# Patient Record
Sex: Female | Born: 1980 | Race: White | Hispanic: No | Marital: Married | Smoking: Current every day smoker
Health system: Southern US, Community
[De-identification: ages and names within clinical notes are randomized; demographics above are authoritative.]

## PROBLEM LIST (undated history)

## (undated) DIAGNOSIS — F1910 Other psychoactive substance abuse, uncomplicated: Secondary | ICD-10-CM

## (undated) DIAGNOSIS — F329 Major depressive disorder, single episode, unspecified: Secondary | ICD-10-CM

## (undated) DIAGNOSIS — J45909 Unspecified asthma, uncomplicated: Secondary | ICD-10-CM

## (undated) DIAGNOSIS — F1124 Opioid dependence with opioid-induced mood disorder: Secondary | ICD-10-CM

## (undated) DIAGNOSIS — F419 Anxiety disorder, unspecified: Secondary | ICD-10-CM

## (undated) DIAGNOSIS — F32A Depression, unspecified: Secondary | ICD-10-CM

---

## 2004-02-06 ENCOUNTER — Observation Stay: Payer: Self-pay

## 2004-02-22 ENCOUNTER — Observation Stay: Payer: Self-pay | Admitting: Unknown Physician Specialty

## 2004-02-24 ENCOUNTER — Observation Stay: Payer: Self-pay | Admitting: Unknown Physician Specialty

## 2004-02-24 ENCOUNTER — Observation Stay: Payer: Self-pay | Admitting: Obstetrics & Gynecology

## 2004-03-16 ENCOUNTER — Observation Stay: Payer: Self-pay

## 2004-03-17 ENCOUNTER — Inpatient Hospital Stay: Payer: Self-pay | Admitting: Obstetrics & Gynecology

## 2004-07-26 ENCOUNTER — Emergency Department: Payer: Self-pay | Admitting: Emergency Medicine

## 2004-11-11 ENCOUNTER — Emergency Department: Payer: Self-pay | Admitting: Emergency Medicine

## 2004-11-15 ENCOUNTER — Emergency Department: Payer: Self-pay | Admitting: General Practice

## 2005-10-22 ENCOUNTER — Emergency Department: Payer: Self-pay | Admitting: Emergency Medicine

## 2006-01-22 ENCOUNTER — Emergency Department: Payer: Self-pay | Admitting: Unknown Physician Specialty

## 2006-08-20 ENCOUNTER — Emergency Department: Payer: Self-pay | Admitting: Emergency Medicine

## 2006-08-21 ENCOUNTER — Ambulatory Visit: Payer: Self-pay | Admitting: Unknown Physician Specialty

## 2007-04-03 ENCOUNTER — Ambulatory Visit: Payer: Self-pay

## 2007-10-06 ENCOUNTER — Observation Stay: Payer: Self-pay

## 2007-10-09 ENCOUNTER — Emergency Department: Payer: Self-pay | Admitting: Emergency Medicine

## 2007-11-04 ENCOUNTER — Observation Stay: Payer: Self-pay

## 2007-11-27 ENCOUNTER — Ambulatory Visit: Payer: Self-pay | Admitting: Obstetrics and Gynecology

## 2007-11-28 ENCOUNTER — Inpatient Hospital Stay: Payer: Self-pay | Admitting: Obstetrics and Gynecology

## 2010-08-05 ENCOUNTER — Emergency Department: Payer: Self-pay | Admitting: Emergency Medicine

## 2010-10-26 ENCOUNTER — Emergency Department: Payer: Self-pay | Admitting: Emergency Medicine

## 2010-11-17 ENCOUNTER — Emergency Department: Payer: Self-pay | Admitting: Emergency Medicine

## 2011-03-07 ENCOUNTER — Emergency Department: Payer: Self-pay | Admitting: Emergency Medicine

## 2012-01-03 ENCOUNTER — Emergency Department: Payer: Self-pay | Admitting: Emergency Medicine

## 2013-03-24 ENCOUNTER — Emergency Department: Payer: Self-pay | Admitting: Emergency Medicine

## 2013-07-11 ENCOUNTER — Emergency Department: Payer: Self-pay | Admitting: Emergency Medicine

## 2013-07-11 LAB — BASIC METABOLIC PANEL
Anion Gap: 4 — ABNORMAL LOW (ref 7–16)
BUN: 16 mg/dL (ref 7–18)
CHLORIDE: 105 mmol/L (ref 98–107)
Calcium, Total: 9.1 mg/dL (ref 8.5–10.1)
Co2: 26 mmol/L (ref 21–32)
Creatinine: 1.07 mg/dL (ref 0.60–1.30)
EGFR (Non-African Amer.): >60
Glucose: 78 mg/dL (ref 65–99)
Osmolality: 270 (ref 275–301)
Potassium: 3.3 mmol/L — ABNORMAL LOW (ref 3.5–5.1)
Sodium: 135 mmol/L — ABNORMAL LOW (ref 136–145)

## 2013-07-11 LAB — CBC WITH DIFFERENTIAL/PLATELET
BASOS ABS: 0 10*3/uL (ref 0.0–0.1)
Basophil %: 0.2 %
EOS ABS: 0.1 10*3/uL (ref 0.0–0.7)
EOS PCT: 0.7 %
HCT: 39.8 % (ref 35.0–47.0)
HGB: 14.1 g/dL (ref 12.0–16.0)
LYMPHS ABS: 1.2 10*3/uL (ref 1.0–3.6)
LYMPHS PCT: 8.3 %
MCH: 31.8 pg (ref 26.0–34.0)
MCHC: 35.6 g/dL (ref 32.0–36.0)
MCV: 90 fL (ref 80–100)
MONOS PCT: 7.9 %
Monocyte #: 1.1 x10 3/mm — ABNORMAL HIGH (ref 0.2–0.9)
NEUTROS PCT: 82.9 %
Neutrophil #: 11.7 10*3/uL — ABNORMAL HIGH (ref 1.4–6.5)
PLATELETS: 193 10*3/uL (ref 150–440)
RBC: 4.45 10*6/uL (ref 3.80–5.20)
RDW: 12.7 % (ref 11.5–14.5)
WBC: 14.2 10*3/uL — ABNORMAL HIGH (ref 3.6–11.0)

## 2013-11-30 ENCOUNTER — Emergency Department: Payer: Self-pay | Admitting: Emergency Medicine

## 2013-11-30 LAB — URINALYSIS, COMPLETE
Bilirubin,UR: NEGATIVE
GLUCOSE, UR: NEGATIVE mg/dL (ref 0–75)
Ketone: NEGATIVE
Leukocyte Esterase: NEGATIVE
NITRITE: NEGATIVE
PH: 5 (ref 4.5–8.0)
PROTEIN: NEGATIVE
RBC,UR: 7 /HPF (ref 0–5)
SPECIFIC GRAVITY: 1.02 (ref 1.003–1.030)
WBC UR: 7 /HPF (ref 0–5)

## 2013-11-30 LAB — CBC WITH DIFFERENTIAL/PLATELET
Basophil #: 0 10*3/uL (ref 0.0–0.1)
Basophil %: 0.5 %
EOS ABS: 0.2 10*3/uL (ref 0.0–0.7)
Eosinophil %: 2.3 %
HCT: 41.5 % (ref 35.0–47.0)
HGB: 14.1 g/dL (ref 12.0–16.0)
LYMPHS ABS: 2 10*3/uL (ref 1.0–3.6)
LYMPHS PCT: 26.5 %
MCH: 31.3 pg (ref 26.0–34.0)
MCHC: 34 g/dL (ref 32.0–36.0)
MCV: 92 fL (ref 80–100)
MONO ABS: 0.4 x10 3/mm (ref 0.2–0.9)
Monocyte %: 5 %
Neutrophil #: 5 10*3/uL (ref 1.4–6.5)
Neutrophil %: 65.7 %
PLATELETS: 222 10*3/uL (ref 150–440)
RBC: 4.51 10*6/uL (ref 3.80–5.20)
RDW: 12.7 % (ref 11.5–14.5)
WBC: 7.5 10*3/uL (ref 3.6–11.0)

## 2013-11-30 LAB — COMPREHENSIVE METABOLIC PANEL
ALBUMIN: 4.2 g/dL (ref 3.4–5.0)
ALT: 17 U/L
ANION GAP: 10 (ref 7–16)
AST: 13 U/L — AB (ref 15–37)
Alkaline Phosphatase: 82 U/L
BUN: 11 mg/dL (ref 7–18)
Bilirubin,Total: 0.4 mg/dL (ref 0.2–1.0)
CREATININE: 0.78 mg/dL (ref 0.60–1.30)
Calcium, Total: 9 mg/dL (ref 8.5–10.1)
Chloride: 105 mmol/L (ref 98–107)
Co2: 28 mmol/L (ref 21–32)
EGFR (African American): >60
EGFR (Non-African Amer.): >60
Glucose: 94 mg/dL (ref 65–99)
OSMOLALITY: 284 (ref 275–301)
POTASSIUM: 3.6 mmol/L (ref 3.5–5.1)
Sodium: 143 mmol/L (ref 136–145)
Total Protein: 7.7 g/dL (ref 6.4–8.2)

## 2013-11-30 LAB — LIPASE, BLOOD: LIPASE: 282 U/L (ref 73–393)

## 2014-02-17 ENCOUNTER — Emergency Department: Payer: Self-pay | Admitting: Emergency Medicine

## 2014-02-17 LAB — CBC
HCT: 40.4 % (ref 35.0–47.0)
HGB: 13.1 g/dL (ref 12.0–16.0)
MCH: 30.7 pg (ref 26.0–34.0)
MCHC: 32.5 g/dL (ref 32.0–36.0)
MCV: 95 fL (ref 80–100)
PLATELETS: 177 10*3/uL (ref 150–440)
RBC: 4.27 10*6/uL (ref 3.80–5.20)
RDW: 12.4 % (ref 11.5–14.5)
WBC: 7.1 10*3/uL (ref 3.6–11.0)

## 2014-02-17 LAB — URINALYSIS, COMPLETE
Bilirubin,UR: NEGATIVE
GLUCOSE, UR: NEGATIVE mg/dL (ref 0–75)
KETONE: NEGATIVE
Leukocyte Esterase: NEGATIVE
NITRITE: NEGATIVE
Ph: 6 (ref 4.5–8.0)
Protein: NEGATIVE
RBC,UR: NONE SEEN /HPF (ref 0–5)
SPECIFIC GRAVITY: 1.024 (ref 1.003–1.030)
Squamous Epithelial: 5

## 2014-02-17 LAB — COMPREHENSIVE METABOLIC PANEL
ALBUMIN: 3.4 g/dL (ref 3.4–5.0)
ALK PHOS: 76 U/L
ALT: 16 U/L
AST: 18 U/L (ref 15–37)
Anion Gap: 8 (ref 7–16)
BUN: 15 mg/dL (ref 7–18)
Bilirubin,Total: 0.3 mg/dL (ref 0.2–1.0)
CHLORIDE: 111 mmol/L — AB (ref 98–107)
Calcium, Total: 8.3 mg/dL — ABNORMAL LOW (ref 8.5–10.1)
Co2: 24 mmol/L (ref 21–32)
Creatinine: 0.67 mg/dL (ref 0.60–1.30)
EGFR (African American): >60
EGFR (Non-African Amer.): >60
Glucose: 87 mg/dL (ref 65–99)
Osmolality: 285 (ref 275–301)
Potassium: 3.8 mmol/L (ref 3.5–5.1)
Sodium: 143 mmol/L (ref 136–145)
TOTAL PROTEIN: 6.5 g/dL (ref 6.4–8.2)

## 2014-05-26 ENCOUNTER — Emergency Department: Payer: Self-pay | Admitting: Emergency Medicine

## 2014-12-14 ENCOUNTER — Emergency Department
Admission: EM | Admit: 2014-12-14 | Discharge: 2014-12-15 | Disposition: A | Discharge status: Other Acute Inpatient Hospital | Payer: Medicaid Other | Attending: Emergency Medicine | Admitting: Emergency Medicine

## 2014-12-14 ENCOUNTER — Encounter: Payer: Self-pay | Admitting: Emergency Medicine

## 2014-12-14 DIAGNOSIS — F329 Major depressive disorder, single episode, unspecified: Secondary | ICD-10-CM | POA: Diagnosis not present

## 2014-12-14 DIAGNOSIS — F1110 Opioid abuse, uncomplicated: Secondary | ICD-10-CM | POA: Insufficient documentation

## 2014-12-14 DIAGNOSIS — R45850 Homicidal ideations: Secondary | ICD-10-CM | POA: Diagnosis present

## 2014-12-14 DIAGNOSIS — Z3202 Encounter for pregnancy test, result negative: Secondary | ICD-10-CM | POA: Insufficient documentation

## 2014-12-14 DIAGNOSIS — N390 Urinary tract infection, site not specified: Secondary | ICD-10-CM | POA: Diagnosis not present

## 2014-12-14 DIAGNOSIS — Z720 Tobacco use: Secondary | ICD-10-CM | POA: Insufficient documentation

## 2014-12-14 DIAGNOSIS — Z79899 Other long term (current) drug therapy: Secondary | ICD-10-CM | POA: Insufficient documentation

## 2014-12-14 DIAGNOSIS — F1410 Cocaine abuse, uncomplicated: Secondary | ICD-10-CM | POA: Diagnosis not present

## 2014-12-14 DIAGNOSIS — F32A Depression, unspecified: Secondary | ICD-10-CM

## 2014-12-14 DIAGNOSIS — F1910 Other psychoactive substance abuse, uncomplicated: Secondary | ICD-10-CM

## 2014-12-14 DIAGNOSIS — F1510 Other stimulant abuse, uncomplicated: Secondary | ICD-10-CM | POA: Diagnosis not present

## 2014-12-14 HISTORY — DX: Unspecified asthma, uncomplicated: J45.909

## 2014-12-14 LAB — URINALYSIS COMPLETE WITH MICROSCOPIC (ARMC ONLY)
Bilirubin Urine: NEGATIVE
Glucose, UA: NEGATIVE mg/dL
Ketones, ur: NEGATIVE mg/dL
Nitrite: POSITIVE — AB
PH: 5 (ref 5.0–8.0)
PROTEIN: NEGATIVE mg/dL
Specific Gravity, Urine: 1.026 (ref 1.005–1.030)

## 2014-12-14 LAB — URINE DRUG SCREEN, QUALITATIVE (ARMC ONLY)
AMPHETAMINES, UR SCREEN: POSITIVE — AB
BENZODIAZEPINE, UR SCRN: NOT DETECTED
Barbiturates, Ur Screen: NOT DETECTED
CANNABINOID 50 NG, UR ~~LOC~~: NOT DETECTED
Cocaine Metabolite,Ur ~~LOC~~: POSITIVE — AB
MDMA (ECSTASY) UR SCREEN: NOT DETECTED
Methadone Scn, Ur: NOT DETECTED
OPIATE, UR SCREEN: POSITIVE — AB
PHENCYCLIDINE (PCP) UR S: NOT DETECTED
Tricyclic, Ur Screen: NOT DETECTED

## 2014-12-14 LAB — COMPREHENSIVE METABOLIC PANEL
ALBUMIN: 4.3 g/dL (ref 3.5–5.0)
ALK PHOS: 81 U/L (ref 38–126)
ALT: 12 U/L — AB (ref 14–54)
AST: 22 U/L (ref 15–41)
Anion gap: 8 (ref 5–15)
BILIRUBIN TOTAL: 0.4 mg/dL (ref 0.3–1.2)
BUN: 13 mg/dL (ref 6–20)
CO2: 26 mmol/L (ref 22–32)
CREATININE: 0.8 mg/dL (ref 0.44–1.00)
Calcium: 8.9 mg/dL (ref 8.9–10.3)
Chloride: 106 mmol/L (ref 101–111)
GFR calc Af Amer: >60 mL/min (ref >60)
GLUCOSE: 91 mg/dL (ref 65–99)
POTASSIUM: 4 mmol/L (ref 3.5–5.1)
Sodium: 140 mmol/L (ref 135–145)
TOTAL PROTEIN: 7.3 g/dL (ref 6.5–8.1)

## 2014-12-14 LAB — ACETAMINOPHEN LEVEL: Acetaminophen (Tylenol), Serum: <10 ug/mL — ABNORMAL LOW (ref 10–30)

## 2014-12-14 LAB — CBC
HEMATOCRIT: 40.2 % (ref 35.0–47.0)
Hemoglobin: 13.3 g/dL (ref 12.0–16.0)
MCH: 30.5 pg (ref 26.0–34.0)
MCHC: 33.1 g/dL (ref 32.0–36.0)
MCV: 92.2 fL (ref 80.0–100.0)
Platelets: 234 10*3/uL (ref 150–440)
RBC: 4.36 MIL/uL (ref 3.80–5.20)
RDW: 12.8 % (ref 11.5–14.5)
WBC: 7.9 10*3/uL (ref 3.6–11.0)

## 2014-12-14 LAB — ETHANOL: Alcohol, Ethyl (B): 7 mg/dL — ABNORMAL HIGH (ref <5)

## 2014-12-14 LAB — SALICYLATE LEVEL: Salicylate Lvl: <4 mg/dL (ref 2.8–30.0)
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
## 2014-12-14 NOTE — ED Provider Notes (Signed)
[facility] Emergency Department Provider Note  ____________________________________________  Time seen: Approximately 11:35 PM  I have reviewed the triage vital signs and the nursing notes.   HISTORY  Chief Complaint Mental Health Problem    HPI Victoria Dorsey is a 34 y.o. female who presents to the ED from home with the chief complaint of depression and homicidal ideation. Patient is depressed over losing custody of her kids. Patient admits to abusing opiates (Percocet, Vicodin); last use yesterday. "I think about hurting people, but not myself". Patient complains of nausea, irritability, restlessness. Denies fever, chills, chest pain, shortness of breath, abdominal pain, diarrhea, weakness, numbness, tingling.   Past Medical History  Diagnosis Date  . Asthma     There are no active problems to display for this patient.   Past Surgical History  Procedure Laterality Date  . Cesarean section      Current Outpatient Rx  Name  Route  Sig  Dispense  Refill  . Buprenorphine HCl-Naloxone HCl (SUBOXONE) 8-2 MG FILM   Sublingual   Place 1 Film under the tongue 2 (two) times daily.           Allergies Review of patient's allergies indicates no known allergies.  No family history on file.  Social History Social History  Substance Use Topics  . Smoking status: Current Every Day Smoker -- 0.50 packs/day    Types: Cigarettes  . Smokeless tobacco: None  . Alcohol Use: Yes     Comment: occasional    Review of Systems Constitutional: No fever/chills Eyes: No visual changes. ENT: No sore throat. Cardiovascular: Denies chest pain. Respiratory: Denies shortness of breath. Gastrointestinal: No abdominal pain.  No nausea, no vomiting.  No diarrhea.  No constipation. Genitourinary: Negative for dysuria. Musculoskeletal: Negative for back pain. Skin: Negative for rash. Neurological: Negative for headaches, focal weakness or  numbness. Psychiatric:Positive for depression and homicidal ideation.  10-point ROS otherwise negative.  ____________________________________________   PHYSICAL EXAM:  VITAL SIGNS: ED Triage Vitals  Enc Vitals Group     BP 12/14/14 2137 116/86 mmHg     Pulse Rate 12/14/14 2137 69     Resp --      Temp 12/14/14 2137 97.9 F (36.6 C)     Temp Source 12/14/14 2137 Oral     SpO2 12/14/14 2137 97 %     Weight 12/14/14 2137 127 lb (57.607 kg)     Height 12/14/14 2137 5\' 1"  (1.549 m)     Head Cir --      Peak Flow --      Pain Score --      Pain Loc --      Pain Edu? --      Excl. in GC? --     Constitutional: Alert and oriented. Disheveled, appears under the influence of substances. Eyes: Conjunctivae are normal. PERRL. EOMI. Head: Atraumatic. Nose: No congestion/rhinnorhea. Mouth/Throat: Mucous membranes are moist.  Oropharynx non-erythematous. Neck: No stridor.   Cardiovascular: Normal rate, regular rhythm. Grossly normal heart sounds.  Good peripheral circulation. Respiratory: Normal respiratory effort.  No retractions. Lungs CTAB. Gastrointestinal: Soft and nontender. No distention. No abdominal bruits. No CVA tenderness. Musculoskeletal: No lower extremity tenderness nor edema.  No joint effusions. Neurologic:  Normal speech and language. No gross focal neurologic deficits are appreciated. No gait instability. Skin:  Skin is warm, dry and intact. No rash noted. Psychiatric: Mood and affect are flat. Speech and behavior are flat.  ____________________________________________   LABS (all  labs ordered are listed, but only abnormal results are displayed)  Labs Reviewed  COMPREHENSIVE METABOLIC PANEL - Abnormal; Notable for the following:    ALT 12 (*)    All other components within normal limits  ETHANOL - Abnormal; Notable for the following:    Alcohol, Ethyl (B) 7 (*)    All other components within normal limits  ACETAMINOPHEN LEVEL - Abnormal; Notable for the  following:    Acetaminophen (Tylenol), Serum <10 (*)    All other components within normal limits  URINE DRUG SCREEN, QUALITATIVE ([facility] ONLY) - Abnormal; Notable for the following:    Amphetamines, Ur Screen POSITIVE (*)    Cocaine Metabolite,Ur [place] POSITIVE (*)    Opiate, Ur Screen POSITIVE (*)    All other components within normal limits  URINALYSIS COMPLETEWITH MICROSCOPIC ([facility] ONLY) - Abnormal; Notable for the following:    Color, Urine YELLOW (*)    APPearance HAZY (*)    Hgb urine dipstick 2+ (*)    Nitrite POSITIVE (*)    Leukocytes, UA TRACE (*)    Bacteria, UA MANY (*)    Squamous Epithelial / LPF 6-30 (*)    All other components within normal limits  SALICYLATE LEVEL  CBC  POC URINE PREG, ED   ____________________________________________  EKG  None ____________________________________________  RADIOLOGY  None ____________________________________________   PROCEDURES  Procedure(s) performed: None  Critical Care performed: No  ____________________________________________   INITIAL IMPRESSION / ASSESSMENT AND PLAN / ED COURSE  Pertinent labs & imaging results that were available during my care of the patient were reviewed by me and considered in my medical decision making (see chart for details).  34 year old female with history of substance abuse who presents with depression and homicidal ideation. Will place patient under IVC. Antibiotic for UTI. Patient is medically cleared for transfer to [facility] awaiting psychiatry consult.  ----------------------------------------- 5:50 AM on 12/15/2014 -----------------------------------------  Events overnight. Patient resting in no acute distress. Psychiatry consult pending this a.m. ____________________________________________   FINAL CLINICAL IMPRESSION(S) / ED DIAGNOSES  Final diagnoses:  Depression  Homicidal ideation  Substance abuse  UTI (lower urinary tract infection)  Cocaine abuse      Irean Hong, MD 12/15/14 [phone]

## 2014-12-15 ENCOUNTER — Inpatient Hospital Stay (HOSPITAL_COMMUNITY)
Admission: AD | Admit: 2014-12-15 | Discharge: 2014-12-18 | DRG: 897 | Disposition: A | Discharge status: Home / Self Care | Payer: Medicaid Other | Source: Intra-hospital | Attending: Psychiatry | Admitting: Psychiatry

## 2014-12-15 ENCOUNTER — Encounter (HOSPITAL_COMMUNITY): Payer: Self-pay | Admitting: *Deleted

## 2014-12-15 DIAGNOSIS — F329 Major depressive disorder, single episode, unspecified: Secondary | ICD-10-CM | POA: Diagnosis present

## 2014-12-15 DIAGNOSIS — F419 Anxiety disorder, unspecified: Secondary | ICD-10-CM | POA: Diagnosis present

## 2014-12-15 DIAGNOSIS — F17210 Nicotine dependence, cigarettes, uncomplicated: Secondary | ICD-10-CM | POA: Diagnosis present

## 2014-12-15 DIAGNOSIS — F1124 Opioid dependence with opioid-induced mood disorder: Secondary | ICD-10-CM | POA: Diagnosis not present

## 2014-12-15 DIAGNOSIS — J45909 Unspecified asthma, uncomplicated: Secondary | ICD-10-CM | POA: Diagnosis present

## 2014-12-15 DIAGNOSIS — F1910 Other psychoactive substance abuse, uncomplicated: Secondary | ICD-10-CM | POA: Diagnosis present

## 2014-12-15 DIAGNOSIS — F1410 Cocaine abuse, uncomplicated: Secondary | ICD-10-CM | POA: Diagnosis not present

## 2014-12-15 HISTORY — DX: Anxiety disorder, unspecified: F41.9

## 2014-12-15 HISTORY — DX: Major depressive disorder, single episode, unspecified: F32.9

## 2014-12-15 HISTORY — DX: Depression, unspecified: F32.A

## 2014-12-15 LAB — PREGNANCY, URINE: PREG TEST UR: NEGATIVE

## 2014-12-15 MED ORDER — ONDANSETRON 4 MG PO TBDP: 4.0000 mg | ORAL_TABLET | Freq: Four times a day (QID) | ORAL | Status: DC | PRN

## 2014-12-15 MED ORDER — SULFAMETHOXAZOLE-TRIMETHOPRIM 800-160 MG PO TABS
1.0000 | ORAL_TABLET | Freq: Two times a day (BID) | ORAL | Status: DC
  Administered 2014-12-15: 1 via ORAL
  Filled 2014-12-15 (×3): qty 1

## 2014-12-15 MED ORDER — LOPERAMIDE HCL 2 MG PO CAPS: 2.0000 mg | ORAL_CAPSULE | ORAL | Status: DC | PRN

## 2014-12-15 MED ORDER — MAGNESIUM HYDROXIDE 400 MG/5ML PO SUSP: 30.0000 mL | Freq: Every day | ORAL | Status: DC | PRN

## 2014-12-15 MED ORDER — ALUM & MAG HYDROXIDE-SIMETH 200-200-20 MG/5ML PO SUSP: 30.0000 mL | ORAL | Status: DC | PRN

## 2014-12-15 MED ORDER — NAPROXEN 500 MG PO TABS
500.0000 mg | ORAL_TABLET | Freq: Two times a day (BID) | ORAL | Status: DC | PRN
  Administered 2014-12-15 – 2014-12-16 (×3): 500 mg via ORAL
  Filled 2014-12-15 (×3): qty 1

## 2014-12-15 MED ORDER — NICOTINE 10 MG IN INHA: 1.0000 | RESPIRATORY_TRACT | Status: DC | PRN

## 2014-12-15 MED ORDER — METHOCARBAMOL 500 MG PO TABS
500.0000 mg | ORAL_TABLET | Freq: Three times a day (TID) | ORAL | Status: DC | PRN
  Administered 2014-12-15 – 2014-12-16 (×3): 500 mg via ORAL
  Filled 2014-12-15 (×3): qty 1

## 2014-12-15 MED ORDER — NICOTINE POLACRILEX 2 MG MT GUM
2.0000 mg | CHEWING_GUM | OROMUCOSAL | Status: DC | PRN
  Administered 2014-12-15: 2 mg via ORAL

## 2014-12-15 MED ORDER — SULFAMETHOXAZOLE-TRIMETHOPRIM 800-160 MG PO TABS
ORAL_TABLET | ORAL | Status: AC
  Filled 2014-12-15: qty 1

## 2014-12-15 MED ORDER — CLONIDINE HCL 0.1 MG PO TABS
0.1000 mg | ORAL_TABLET | ORAL | Status: DC
  Filled 2014-12-15 (×3): qty 1

## 2014-12-15 MED ORDER — HYDROXYZINE HCL 25 MG PO TABS
25.0000 mg | ORAL_TABLET | Freq: Four times a day (QID) | ORAL | Status: DC | PRN
  Administered 2014-12-15 – 2014-12-16 (×2): 25 mg via ORAL
  Filled 2014-12-15 (×2): qty 1

## 2014-12-15 MED ORDER — DICYCLOMINE HCL 20 MG PO TABS: 20.0000 mg | ORAL_TABLET | Freq: Four times a day (QID) | ORAL | Status: DC | PRN

## 2014-12-15 MED ORDER — ACETAMINOPHEN 325 MG PO TABS: 650.0000 mg | ORAL_TABLET | Freq: Four times a day (QID) | ORAL | Status: DC | PRN

## 2014-12-15 MED ORDER — CLONIDINE HCL 0.1 MG PO TABS: 0.1000 mg | ORAL_TABLET | Freq: Every day | ORAL | Status: DC

## 2014-12-15 MED ORDER — CLONIDINE HCL 0.1 MG PO TABS
0.1000 mg | ORAL_TABLET | Freq: Four times a day (QID) | ORAL | Status: AC
  Administered 2014-12-15: 0.1 mg via ORAL
  Filled 2014-12-15 (×12): qty 1

## 2014-12-15 MED ORDER — TRAZODONE HCL 50 MG PO TABS
50.0000 mg | ORAL_TABLET | Freq: Every evening | ORAL | Status: DC | PRN
  Administered 2014-12-15 – 2014-12-16 (×2): 50 mg via ORAL
  Filled 2014-12-15 (×2): qty 1

## 2014-12-15 NOTE — ED Notes (Signed)
Hand-off report given to Gary, RN.  

## 2014-12-15 NOTE — ED Notes (Signed)
Pt. C/o nausea, given ginger ale to settle stomach.

## 2014-12-15 NOTE — Progress Notes (Signed)
LCSW met with patient, she reported she feels very misunderstood and feels badly after she lost her children. She still feels depressed.

## 2014-12-15 NOTE — BHH Counselor (Signed)
Per Dr. Lucianne Muss in the [facility], look for inpatient. Printed [facility] Assessment and Facesheet.

## 2014-12-15 NOTE — ED Notes (Signed)
Pt. Noted sleeping in room. No complaints or concerns voiced. No distress or abnormal behavior noted. Will continue to monitor with security cameras. Q 15 minute rounds continue. 

## 2014-12-15 NOTE — ED Notes (Signed)
Pt Sleeping

## 2014-12-15 NOTE — ED Notes (Signed)
BEHAVIORAL HEALTH ROUNDING Patient sleeping: Yes.   Patient alert and oriented: not applicable Behavior appropriate: Yes.  ; If no, describe:  Nutrition and fluids offered: No Toileting and hygiene offered: Yes  Sitter present: no Law enforcement present: Yes  

## 2014-12-15 NOTE — ED Provider Notes (Signed)
Patient is accepted in transfer to [facility] by Dr. Jama Flavors. I discussed this with Dr. Lucianne Muss of our psychiatric service, and they have arranged transfer. I have reexamined the patient and discussed with the patient risk/benefit who is agreeable. Awake alert. No acute distress vital signs stable at this time.  Sharyn Creamer, MD 12/16/14 [phone]

## 2014-12-15 NOTE — BHH Counselor (Signed)
Pt. is to be admitted to [facility] (Per Intake Minerva Areola) by  Dr. Lucianne Muss. Attending Physician will be Dr. Jama Flavors.  Pt. has been assigned to room 401 Bed 2.   Misty Stanley, ER Sect.; Dr. Fanny Bien, ER MD; Annette Stable, Patient's Nurse were notified.

## 2014-12-15 NOTE — ED Notes (Signed)

## 2014-12-15 NOTE — ED Notes (Signed)
BEHAVIORAL HEALTH ROUNDING Patient sleeping: Yes.   Patient alert and oriented: yes Behavior appropriate: Yes.  ; If no, describe:  Nutrition and fluids offered: Yes  Toileting and hygiene offered: Yes  Sitter present: No Law enforcement present: Yes  

## 2014-12-15 NOTE — ED Notes (Signed)
Pt. To [facility] from ED ambulatory without difficulty, to room 4  . Report from [facility]. Pt. Is alert and oriented, warm and dry in no distress. Pt. Denies SI, HI, and AVH. Pt. Calm and cooperative. Pt. Made aware of security cameras and Q15 minute rounds. Pt. Encouraged to let Nursing staff know of any concerns or needs.

## 2014-12-15 NOTE — Tx Team (Signed)
Initial Interdisciplinary Treatment Plan   PATIENT STRESSORS: Financial difficulties Legal issue Marital or family conflict Substance abuse Traumatic event   PATIENT STRENGTHS: Average or above average intelligence Capable of independent living Communication skills General fund of knowledge Motivation for treatment/growth Physical Health   PROBLEM LIST: Problem List/Patient Goals Date to be addressed Date deferred Reason deferred Estimated date of resolution  "substance abuse" 12/15/2014   D/c  "depression" 12/15/2014   D/c  "anxiety" 12/15/2014   D/c  "suicidal thoughts" 12/15/2014   D/c                                 DISCHARGE CRITERIA:  Ability to meet basic life and health needs Adequate post-discharge living arrangements Improved stabilization in mood, thinking, and/or behavior Medical problems require only outpatient monitoring Motivation to continue treatment in a less acute level of care Need for constant or close observation no longer present Reduction of life-threatening or endangering symptoms to within safe limits Safe-care adequate arrangements made Verbal commitment to aftercare and medication compliance Withdrawal symptoms are absent or subacute and managed without 24-hour nursing intervention  PRELIMINARY DISCHARGE PLAN: Attend aftercare/continuing care group Attend PHP/IOP Attend 12-step recovery group Outpatient therapy Participate in family therapy Return to previous living arrangement  PATIENT/FAMIILY INVOLVEMENT: This treatment plan has been presented to and reviewed with the patient, [place].  The patient and family have been given the opportunity to ask questions and make suggestions.  Earline Mayotte 12/15/2014, 6:30 PM
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
## 2014-12-15 NOTE — ED Notes (Signed)
BEHAVIORAL HEALTH ROUNDING Patient sleeping: No. Patient alert and oriented: yes Behavior appropriate: Yes.  ; If no, describe:  Nutrition and fluids offered: Yes  Toileting and hygiene offered: Yes  Sitter present: no Law enforcement present: Yes  

## 2014-12-15 NOTE — ED Notes (Signed)
BEHAVIORAL HEALTH ROUNDING Patient sleeping: Yes.   Patient alert and oriented: not applicable Behavior appropriate: Yes.  ; If no, describe:  Nutrition and fluids offered: Yes  Toileting and hygiene offered: Yes  Sitter present: no Law enforcement present: Yes  

## 2014-12-15 NOTE — BH Assessment (Signed)
Assessment Note  Victoria Dorsey is an 34 y.o. female. Victoria Dorsey arrived to the ED under the influence of pills.  She reports that "I was depressed. I use pills to take away my depression and it doesn't work".  She reports to the TTS that she takes between 8 to 10 Vicodin and Percocet's a day when she can get them.  She reports that "I'm depressed and I cry all the time".  She further states that she has a lot of anger inside and wants to sleep all the time. "I take my anger out on other people when I am mad". In addition, she states that she does not want to be around other people.  She denied anxiety symptoms.  She denied having auditory or visual hallucinations.  She denied suicidal or homicidal ideation or intent.    Patient reports that she has 4 children, 2 have gone to live with their father. The other 2 children have been placed in foster care.  Victoria Dorsey is scheduled for a court date on 01/15/2015 for school attendance law violation. UDS returned positive results for amphetamines, cocaine, and opiates. Victoria Dorsey states this was her first time using cocaine. ETOH = 7.  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 Asthma    Axis IV: other psychosocial or environmental problems and problems related to legal system/crime Axis V: 41-50 serious symptoms  Past Medical History:  Past Medical History  Diagnosis Date  . Asthma     Past Surgical History  Procedure Laterality Date  . Cesarean section      Family History: No family history on file.  Social History:  reports that she has been smoking Cigarettes.  She has been smoking about 0.50 packs per day. She does not have any smokeless tobacco history on file. She reports that she drinks alcohol. Her drug history is not on file.  Additional Social History:  Alcohol / Drug Use History of alcohol / drug use?: Yes Negative Consequences of Use:  Financial Substance #1 Name of Substance 1: pills (Percocet & Vicodan) 1 - Age of First Use: 32 1 - Amount (size/oz): 8 - 10 pills 1 - Frequency: "Whenever I can get them 1 - Last Use / Amount: 12/14/2014  CIWA: CIWA-Ar BP: 116/86 mmHg Pulse Rate: 69 COWS:    Allergies: No Known Allergies  Home Medications:  (Not in a hospital admission)  OB/GYN Status:  Patient's last menstrual period was 12/03/2014 (exact date).  General Assessment Data Location of Assessment: [facility] ED TTS Assessment: In system Is this a Tele or Face-to-Face Assessment?: Face-to-Face Is this an Initial Assessment or a Re-assessment for this encounter?: Initial Assessment Marital status: Separated Maiden name: Frazier Richards Is patient pregnant?: No Pregnancy Status: No Living Arrangements: Non-relatives/Friends Can pt return to current living arrangement?: Yes Admission Status: Involuntary Is patient capable of signing voluntary admission?: Yes Referral Source: MD Insurance type: Medicaid  Medical Screening Exam [facility] Walk-in ONLY) Medical Exam completed: Yes  Crisis Care Plan Living Arrangements: Non-relatives/Friends Name of Psychiatrist: None Name of Therapist: None  Education Status Is patient currently in school?: No Current Grade: na Highest grade of school patient has completed: 10th Name of school: [employer] person: na  Risk to self with the past 6 months Suicidal Ideation: No (Denied) Has patient been a risk to self within the past 6 months prior to admission? : No Suicidal  Intent: No Has patient had any suicidal intent within the past 6 months prior to admission? : No Is patient at risk for suicide?: No Suicidal Plan?: No Has patient had any suicidal plan within the past 6 months prior to admission? : No Access to Means: Yes Specify Access to Suicidal Means: Access to drugs and potential accidental suicide What has been your use of drugs/alcohol within the last 12  months?: Pills Previous Attempts/Gestures: No How many times?: 0 Other Self Harm Risks: None reported Triggers for Past Attempts: None known Intentional Self Injurious Behavior: None Family Suicide History: No Recent stressful life event(s):  (DSS involvement, Corporate treasurer) Persecutory voices/beliefs?: No Depression: Yes Depression Symptoms: Despondent, Tearfulness, Loss of interest in usual pleasures, Feeling angry/irritable Substance abuse history and/or treatment for substance abuse?: No Suicide prevention information given to non-admitted patients: Not applicable  Risk to Others within the past 6 months Homicidal Ideation: No Does patient have any lifetime risk of violence toward others beyond the six months prior to admission? : No Thoughts of Harm to Others: No Current Homicidal Intent: No Current Homicidal Plan: No Access to Homicidal Means: No Identified Victim: None reported History of harm to others?: No Assessment of Violence: On admission Violent Behavior Description: None reported Does patient have access to weapons?: No Criminal Charges Pending?: Yes Describe Pending Criminal Charges: school attendance law violation Does patient have a court date: Yes Court Date: 01/15/15 Is patient on probation?: No  Psychosis Hallucinations: None noted Delusions: None noted  Mental Status Report Appearance/Hygiene: In scrubs Eye Contact: Fair Motor Activity: Restlessness, Unremarkable Speech: Tangential Level of Consciousness: Alert Mood: Depressed Affect: Sad Anxiety Level: Moderate Thought Processes: Coherent Judgement: Unable to Assess Orientation: Person, Place, Situation Obsessive Compulsive Thoughts/Behaviors: None     ADLScreening [facility] Assessment Services) Patient's cognitive ability adequate to safely complete daily activities?: Yes Patient able to express need for assistance with ADLs?: Yes Independently performs ADLs?: Yes (appropriate for  developmental age)  Prior Inpatient Therapy Prior Inpatient Therapy: No  Prior Outpatient Therapy Prior Outpatient Therapy: No Does patient have an ACCT team?: No Does patient have Intensive In-House Services?  : No Does patient have [facility] services? : No Does patient have P4CC services?: No  ADL Screening (condition at time of admission) Patient's cognitive ability adequate to safely complete daily activities?: Yes Patient able to express need for assistance with ADLs?: Yes Independently performs ADLs?: Yes (appropriate for developmental age)       Abuse/Neglect Assessment (Assessment to be complete while patient is alone) Physical Abuse: Denies Verbal Abuse: Denies Sexual Abuse: Denies Exploitation of patient/patient's resources: Denies Self-Neglect: Denies Values / Beliefs Cultural Requests During Hospitalization: None Spiritual Requests During Hospitalization: None   Advance Directives (For Healthcare) Does patient have an advance directive?: No Would patient like information on creating an advanced directive?: Yes - Educational materials given    Additional Information 1:1 In Past 12 Months?: No CIRT Risk: No Elopement Risk: No Does patient have medical clearance?: Yes     Disposition:  Disposition Initial Assessment Completed for this Encounter: Yes Disposition of Patient:  (To be seen by the psychiatrist)  On Site Evaluation by:   Reviewed with Physician:    Justice Deeds 12/15/2014 1:43 AM

## 2014-12-15 NOTE — ED Notes (Signed)
Notified Victoria Dorsey at [facility] of pts departure from here via [employer]

## 2014-12-15 NOTE — ED Notes (Signed)
Pt given lunch tray.

## 2014-12-16 ENCOUNTER — Encounter (HOSPITAL_COMMUNITY): Payer: Self-pay | Admitting: Psychiatry

## 2014-12-16 DIAGNOSIS — F1124 Opioid dependence with opioid-induced mood disorder: Principal | ICD-10-CM

## 2014-12-16 MED ORDER — NICOTINE 21 MG/24HR TD PT24
21.0000 mg | MEDICATED_PATCH | Freq: Every day | TRANSDERMAL | Status: DC
  Administered 2014-12-16 – 2014-12-17 (×2): 21 mg via TRANSDERMAL
  Filled 2014-12-16 (×6): qty 1

## 2014-12-16 MED ORDER — CEPHALEXIN 500 MG PO CAPS
500.0000 mg | ORAL_CAPSULE | Freq: Two times a day (BID) | ORAL | Status: DC
  Administered 2014-12-16 – 2014-12-18 (×4): 500 mg via ORAL
  Filled 2014-12-16 (×6): qty 1

## 2014-12-16 NOTE — Progress Notes (Signed)
Recreation Therapy Notes  Date: 08.17.2016 Time: 9:30am Location: 300 Hall Group room   Group Topic: Stress Management  Goal Area(s) Addresses:  Patient will actively participate in stress management techniques presented during session.   Behavioral Response: Did not attend.   Trayveon Beckford L Joselyn Edling, LRT/CTRS  Mitul Hallowell L 12/16/2014 2:59 PM 
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
## 2014-12-16 NOTE — Progress Notes (Signed)
Pt has been in the dayroom most of the evening and attended evening group.  Pt presents flat and depressed.  She was admitted sometime today.  Her main concern has been her back pain.  She is on the Clonidine protocol, but her BP was too low to give the HS dose.  She was given Naproxen and Robaxin prn per protocol.  She denied SI/HI/AVH during our conversation.  Her nicotine gum was changed to a patched d/t her not having teeth.  She says she has been without her dentures for about 2 yrs.  Support and encouragement offered.  Pt was encouraged to make her needs known to staff.  Safety maintained with q15 minute checks.

## 2014-12-16 NOTE — H&P (Signed)
Psychiatric Admission Assessment Adult  Patient Identification: Victoria Dorsey MRN:  [id number] Date of Evaluation:  12/16/2014 Chief Complaint:  MDD Principal Diagnosis: Opioid dependence with opioid-induced mood disorder Diagnosis:   Patient Active Problem List   Diagnosis Date Noted  . Opioid dependence with opioid-induced mood disorder [F11.24]   . Polysubstance abuse [F19.10] 12/15/2014   History of Present Illness:  Victoria Dorsey, 34 y.o. Female initially seen in the ED after taking Vicodin and Percocet pills due to her depression.  States this was how she functioned daily.  She reported feeling isolated and easily angry towards family members.  She did not endorse having auditory or visual hallucinations. She denied suicidal or homicidal ideation or intent.  UDS returned positive results for amphetamines, cocaine, and opiates.  Denies IVDA, alcohol and benzo abuse.    Elements:  Location:  substance abuse. Quality:  felt hopeless, worthless, anxious. Duration:  chronic. Context:  see HPI. Associated Signs/Symptoms: Depression Symptoms:  depressed mood, feelings of worthlessness/guilt, hopelessness, anxiety, (Hypo) Manic Symptoms:  Irritable Mood, Labiality of Mood, Anxiety Symptoms:  Social Anxiety, Psychotic Symptoms:  NA PTSD Symptoms: NA Total Time spent with patient: 30 minutes  Past Medical History:  Past Medical History  Diagnosis Date  . Asthma   . Anxiety   . Depression     Past Surgical History  Procedure Laterality Date  . Cesarean section     Family History: History reviewed. No pertinent family history. Social History:  History  Alcohol Use  . 0.6 oz/week  . 1 Shots of liquor per week    Comment: special occasion, once monthly     History  Drug Use  . Yes  . Special: Cocaine, Other-see comments    Comment: cocaine, oiates, percocet    Social History   Social History  . Marital Status: Married    Spouse Name: N/A  . Number  of Children: N/A  . Years of Education: N/A   Social History Main Topics  . Smoking status: Current Every Day Smoker -- 1.00 packs/day for 20 years    Types: Cigarettes  . Smokeless tobacco: None  . Alcohol Use: 0.6 oz/week    1 Shots of liquor per week     Comment: special occasion, once monthly  . Drug Use: Yes    Special: Cocaine, Other-see comments     Comment: cocaine, oiates, percocet  . Sexual Activity: Yes    Birth Control/ Protection: Other-see comments     Comment: tubal ligation   Other Topics Concern  . None   Social History Narrative   Additional Social History:    Pain Medications: percocet, opiates Prescriptions: none Over the Counter: buys off street History of alcohol / drug use?: Yes Longest period of sobriety (when/how long): several days Negative Consequences of Use: Financial, Legal, Personal relationships Withdrawal Symptoms: Other (Comment) (anxiety, depression) Name of Substance 1: pain pills, percocet, opiates 1 - Age of First Use: 6 months ago 1 - Amount (size/oz): 10 pills every day 1 - Frequency: daily 1 - Duration: past 6 months 1 - Last Use / Amount: 12/14/2014   Musculoskeletal: Strength & Muscle Tone: within normal limits Gait & Station: normal Patient leans: N/A  Psychiatric Specialty Exam: Physical Exam  Vitals reviewed. Psychiatric: Her mood appears anxious. Her affect is labile. She exhibits a depressed mood.    Review of Systems  All other systems reviewed and are negative.   Blood pressure 95/57, pulse 75, temperature 97.6 F (36.4 C), temperature  source Oral, resp. rate 16, height _0  (1.549 m), weight 59.875 kg (132 lb), last menstrual period 12/03/2014, SpO2 99 %.Body mass index is 24.95 kg/(m^2).   General Appearance: Fairly GroomedNeat  Eye Contact:: Good  Speech: Normal Rate  Volume: Normal  Mood: patient denies depression.   Affect: reactivel so/mewhat labile   Thought Process: Goal Directed and  Linear  Orientation: Full (Time, Place, and Person)  Thought Content: denies hallucinations, no delusions   Suicidal Thoughts: No at this time denies any suicidal Thoughts or any intention of hurting self or anyone else   Homicidal Thoughts: No  Memory: recent and remote grossly intact   Judgement: Fair  Insight: Fair  Psychomotor Activity: Normal- no Psychomotor agitation or restlessness noted at this time .  Concentration: Good  Recall: Good  Fund of Knowledge:Good  Language: Good  Akathisia: Negative  Handed: Right  AIMS (if indicated):    Assets: Communication Skills Desire for ImprovementResiliency  Sleep: Number of Hours: 6.5  Cognition: WNL  ADL's: Normal       Risk to Self: Is patient at risk for suicide?: No Risk to Others:   Prior Inpatient Therapy:   Prior Outpatient Therapy:    Alcohol Screening: 1. How often do you have a drink containing alcohol?: Monthly or less 2. How many drinks containing alcohol do you have on a typical day when you are drinking?: 3 or 4 3. How often do you have six or more drinks on one occasion?: Never Preliminary Score: 1 9. Have you or someone else been injured as a result of your drinking?: No 10. Has a relative or friend or a doctor or another health worker been concerned about your drinking or suggested you cut down?: No Alcohol Use Disorder Identification Test Final Score (AUDIT): 2 Brief Intervention: AUDIT score less than 7 or less-screening does not suggest unhealthy drinking-brief intervention not indicated  Allergies:  No Known Allergies Lab Results:  Results for orders placed or performed during the hospital encounter of 12/14/14 (from the past 48 hour(s))  Comprehensive metabolic panel     Status: Abnormal   Collection Time: 12/14/14  9:42 PM  Result Value Ref Range   Sodium 140 135 - 145 mmol/L   Potassium 4.0 3.5 - 5.1 mmol/L   Chloride 106 101 - 111 mmol/L   CO2 26 22 - 32 mmol/L    Glucose, Bld 91 65 - 99 mg/dL   BUN 13 6 - 20 mg/dL   Creatinine, Ser 0.80 0.44 - 1.00 mg/dL   Calcium 8.9 8.9 - 10.3 mg/dL   Total Protein 7.3 6.5 - 8.1 g/dL   Albumin 4.3 3.5 - 5.0 g/dL   AST 22 15 - 41 U/L   ALT 12 (L) 14 - 54 U/L   Alkaline Phosphatase 81 38 - 126 U/L   Total Bilirubin 0.4 0.3 - 1.2 mg/dL   GFR calc non Af Amer >60 >60 mL/min   GFR calc Af Amer >60 >60 mL/min    Comment: (NOTE) The eGFR has been calculated using the CKD EPI equation. This calculation has not been validated in all clinical situations. eGFR's persistently <60 mL/min signify possible Chronic Kidney Disease.    Anion gap 8 5 - 15  Ethanol (ETOH)     Status: Abnormal   Collection Time: 12/14/14  9:42 PM  Result Value Ref Range   Alcohol, Ethyl (B) 7 (H) <5 mg/dL    Comment:        LOWEST DETECTABLE LIMIT  FOR SERUM ALCOHOL IS 5 mg/dL FOR MEDICAL PURPOSES ONLY   Salicylate level     Status: None   Collection Time: 12/14/14  9:42 PM  Result Value Ref Range   Salicylate Lvl <2.0 2.8 - 30.0 mg/dL  Acetaminophen level     Status: Abnormal   Collection Time: 12/14/14  9:42 PM  Result Value Ref Range   Acetaminophen (Tylenol), Serum <10 (L) 10 - 30 ug/mL    Comment:        THERAPEUTIC CONCENTRATIONS VARY SIGNIFICANTLY. A RANGE OF 10-30 ug/mL MAY BE AN EFFECTIVE CONCENTRATION FOR MANY PATIENTS. HOWEVER, SOME ARE BEST TREATED AT CONCENTRATIONS OUTSIDE THIS RANGE. ACETAMINOPHEN CONCENTRATIONS >150 ug/mL AT 4 HOURS AFTER INGESTION AND >50 ug/mL AT 12 HOURS AFTER INGESTION ARE OFTEN ASSOCIATED WITH TOXIC REACTIONS.   CBC     Status: None   Collection Time: 12/14/14  9:42 PM  Result Value Ref Range   WBC 7.9 3.6 - 11.0 K/uL   RBC 4.36 3.80 - 5.20 MIL/uL   Hemoglobin 13.3 12.0 - 16.0 g/dL   HCT 40.2 35.0 - 47.0 %   MCV 92.2 80.0 - 100.0 fL   MCH 30.5 26.0 - 34.0 pg   MCHC 33.1 32.0 - 36.0 g/dL   RDW 12.8 11.5 - 14.5 %   Platelets 234 150 - 440 K/uL  Urine Drug Screen, Qualitative  ([facility] only)     Status: Abnormal   Collection Time: 12/14/14  9:42 PM  Result Value Ref Range   Tricyclic, Ur Screen NONE DETECTED NONE DETECTED   Amphetamines, Ur Screen POSITIVE (A) NONE DETECTED   MDMA (Ecstasy)Ur Screen NONE DETECTED NONE DETECTED   Cocaine Metabolite,Ur [place] POSITIVE (A) NONE DETECTED   Opiate, Ur Screen POSITIVE (A) NONE DETECTED   Phencyclidine (PCP) Ur S NONE DETECTED NONE DETECTED   Cannabinoid 50 Ng, Ur [place] NONE DETECTED NONE DETECTED   Barbiturates, Ur Screen NONE DETECTED NONE DETECTED   Benzodiazepine, Ur Scrn NONE DETECTED NONE DETECTED   Methadone Scn, Ur NONE DETECTED NONE DETECTED    Comment: (NOTE) 254  Tricyclics, urine               Cutoff 1000 ng/mL 200  Amphetamines, urine             Cutoff 1000 ng/mL 300  MDMA (Ecstasy), urine           Cutoff 500 ng/mL 400  Cocaine Metabolite, urine       Cutoff 300 ng/mL 500  Opiate, urine                   Cutoff 300 ng/mL 600  Phencyclidine (PCP), urine      Cutoff 25 ng/mL 700  Cannabinoid, urine              Cutoff 50 ng/mL 800  Barbiturates, urine             Cutoff 200 ng/mL 900  Benzodiazepine, urine           Cutoff 200 ng/mL 1000 Methadone, urine                Cutoff 300 ng/mL 1100 1200 The urine drug screen provides only a preliminary, unconfirmed 1300 analytical test result and should not be used for non-medical 1400 purposes. Clinical consideration and professional judgment should 1500 be applied to any positive drug screen result due to possible 1600 interfering substances. A more specific alternate chemical method 1700 must be used in order to  obtain a confirmed analytical result.  1800 Gas chromato graphy / mass spectrometry (GC/MS) is the preferred 1900 confirmatory method.   Urinalysis complete, with microscopic ([facility] only)     Status: Abnormal   Collection Time: 12/14/14  9:42 PM  Result Value Ref Range   Color, Urine YELLOW (A) YELLOW   APPearance HAZY (A) CLEAR   Glucose, UA NEGATIVE  NEGATIVE mg/dL   Bilirubin Urine NEGATIVE NEGATIVE   Ketones, ur NEGATIVE NEGATIVE mg/dL   Specific Gravity, Urine 1.026 1.005 - 1.030   Hgb urine dipstick 2+ (A) NEGATIVE   pH 5.0 5.0 - 8.0   Protein, ur NEGATIVE NEGATIVE mg/dL   Nitrite POSITIVE (A) NEGATIVE   Leukocytes, UA TRACE (A) NEGATIVE   RBC / HPF 0-5 0 - 5 RBC/hpf   WBC, UA 6-30 0 - 5 WBC/hpf   Bacteria, UA MANY (A) NONE SEEN   Squamous Epithelial / LPF 6-30 (A) NONE SEEN   Mucous PRESENT   Pregnancy, urine     Status: None   Collection Time: 12/14/14  9:42 PM  Result Value Ref Range   Preg Test, Ur NEGATIVE NEGATIVE   Current Medications: Current Facility-Administered Medications  Medication Dose Route Frequency Provider Last Rate Last Dose  . acetaminophen (TYLENOL) tablet 650 mg  650 mg Oral Q6H PRN Niel Hummer, NP      . alum & mag hydroxide-simeth (MAALOX/MYLANTA) 200-200-20 MG/5ML suspension 30 mL  30 mL Oral Q4H PRN Niel Hummer, NP      . cephALEXin (KEFLEX) capsule 500 mg  500 mg Oral Q12H Kerrie Buffalo, NP      . cloNIDine (CATAPRES) tablet 0.1 mg  0.1 mg Oral QID Niel Hummer, NP   Stopped at 12/16/14 0800   Followed by  . [START ON 12/18/2014] cloNIDine (CATAPRES) tablet 0.1 mg  0.1 mg Oral BH-qamhs Niel Hummer, NP       Followed by  . [START ON 12/20/2014] cloNIDine (CATAPRES) tablet 0.1 mg  0.1 mg Oral QAC breakfast Niel Hummer, NP      . dicyclomine (BENTYL) tablet 20 mg  20 mg Oral Q6H PRN Niel Hummer, NP      . hydrOXYzine (ATARAX/VISTARIL) tablet 25 mg  25 mg Oral Q6H PRN Niel Hummer, NP   25 mg at 12/16/14 1627  . loperamide (IMODIUM) capsule 2-4 mg  2-4 mg Oral PRN Niel Hummer, NP      . magnesium hydroxide (MILK OF MAGNESIA) suspension 30 mL  30 mL Oral Daily PRN Niel Hummer, NP      . methocarbamol (ROBAXIN) tablet 500 mg  500 mg Oral Q8H PRN Niel Hummer, NP   500 mg at 12/16/14 1210  . naproxen (NAPROSYN) tablet 500 mg  500 mg Oral BID PRN Niel Hummer, NP   500 mg at 12/16/14  1035  . nicotine (NICODERM CQ - dosed in mg/24 hours) patch 21 mg  21 mg Transdermal Daily Laverle Hobby, PA-C   21 mg at 12/16/14 1035  . ondansetron (ZOFRAN-ODT) disintegrating tablet 4 mg  4 mg Oral Q6H PRN Niel Hummer, NP      . traZODone (DESYREL) tablet 50 mg  50 mg Oral QHS PRN Niel Hummer, NP   50 mg at 12/15/14 2154   PTA Medications: Prescriptions prior to admission  Medication Sig Dispense Refill Last Dose  . Buprenorphine HCl-Naloxone HCl (SUBOXONE) 8-2 MG FILM Place 1 Film under the tongue 2 (  two) times daily.   unknown at unknown    Previous Psychotropic Medications: Yes   Substance Abuse History in the last 12 months:  Yes.    Consequences of Substance Abuse: inpatient admission  Results for orders placed or performed during the hospital encounter of 12/14/14 (from the past 72 hour(s))  Comprehensive metabolic panel     Status: Abnormal   Collection Time: 12/14/14  9:42 PM  Result Value Ref Range   Sodium 140 135 - 145 mmol/L   Potassium 4.0 3.5 - 5.1 mmol/L   Chloride 106 101 - 111 mmol/L   CO2 26 22 - 32 mmol/L   Glucose, Bld 91 65 - 99 mg/dL   BUN 13 6 - 20 mg/dL   Creatinine, Ser 0.80 0.44 - 1.00 mg/dL   Calcium 8.9 8.9 - 10.3 mg/dL   Total Protein 7.3 6.5 - 8.1 g/dL   Albumin 4.3 3.5 - 5.0 g/dL   AST 22 15 - 41 U/L   ALT 12 (L) 14 - 54 U/L   Alkaline Phosphatase 81 38 - 126 U/L   Total Bilirubin 0.4 0.3 - 1.2 mg/dL   GFR calc non Af Amer >60 >60 mL/min   GFR calc Af Amer >60 >60 mL/min    Comment: (NOTE) The eGFR has been calculated using the CKD EPI equation. This calculation has not been validated in all clinical situations. eGFR's persistently <60 mL/min signify possible Chronic Kidney Disease.    Anion gap 8 5 - 15  Ethanol (ETOH)     Status: Abnormal   Collection Time: 12/14/14  9:42 PM  Result Value Ref Range   Alcohol, Ethyl (B) 7 (H) <5 mg/dL    Comment:        LOWEST DETECTABLE LIMIT FOR SERUM ALCOHOL IS 5 mg/dL FOR MEDICAL  PURPOSES ONLY   Salicylate level     Status: None   Collection Time: 12/14/14  9:42 PM  Result Value Ref Range   Salicylate Lvl <9.5 2.8 - 30.0 mg/dL  Acetaminophen level     Status: Abnormal   Collection Time: 12/14/14  9:42 PM  Result Value Ref Range   Acetaminophen (Tylenol), Serum <10 (L) 10 - 30 ug/mL    Comment:        THERAPEUTIC CONCENTRATIONS VARY SIGNIFICANTLY. A RANGE OF 10-30 ug/mL MAY BE AN EFFECTIVE CONCENTRATION FOR MANY PATIENTS. HOWEVER, SOME ARE BEST TREATED AT CONCENTRATIONS OUTSIDE THIS RANGE. ACETAMINOPHEN CONCENTRATIONS >150 ug/mL AT 4 HOURS AFTER INGESTION AND >50 ug/mL AT 12 HOURS AFTER INGESTION ARE OFTEN ASSOCIATED WITH TOXIC REACTIONS.   CBC     Status: None   Collection Time: 12/14/14  9:42 PM  Result Value Ref Range   WBC 7.9 3.6 - 11.0 K/uL   RBC 4.36 3.80 - 5.20 MIL/uL   Hemoglobin 13.3 12.0 - 16.0 g/dL   HCT 40.2 35.0 - 47.0 %   MCV 92.2 80.0 - 100.0 fL   MCH 30.5 26.0 - 34.0 pg   MCHC 33.1 32.0 - 36.0 g/dL   RDW 12.8 11.5 - 14.5 %   Platelets 234 150 - 440 K/uL  Urine Drug Screen, Qualitative ([facility] only)     Status: Abnormal   Collection Time: 12/14/14  9:42 PM  Result Value Ref Range   Tricyclic, Ur Screen NONE DETECTED NONE DETECTED   Amphetamines, Ur Screen POSITIVE (A) NONE DETECTED   MDMA (Ecstasy)Ur Screen NONE DETECTED NONE DETECTED   Cocaine Metabolite,Ur [place] POSITIVE (A) NONE DETECTED   Opiate, Ur Screen  POSITIVE (A) NONE DETECTED   Phencyclidine (PCP) Ur S NONE DETECTED NONE DETECTED   Cannabinoid 50 Ng, Ur [place] NONE DETECTED NONE DETECTED   Barbiturates, Ur Screen NONE DETECTED NONE DETECTED   Benzodiazepine, Ur Scrn NONE DETECTED NONE DETECTED   Methadone Scn, Ur NONE DETECTED NONE DETECTED    Comment: (NOTE) 099  Tricyclics, urine               Cutoff 1000 ng/mL 200  Amphetamines, urine             Cutoff 1000 ng/mL 300  MDMA (Ecstasy), urine           Cutoff 500 ng/mL 400  Cocaine Metabolite, urine       Cutoff 300  ng/mL 500  Opiate, urine                   Cutoff 300 ng/mL 600  Phencyclidine (PCP), urine      Cutoff 25 ng/mL 700  Cannabinoid, urine              Cutoff 50 ng/mL 800  Barbiturates, urine             Cutoff 200 ng/mL 900  Benzodiazepine, urine           Cutoff 200 ng/mL 1000 Methadone, urine                Cutoff 300 ng/mL 1100 1200 The urine drug screen provides only a preliminary, unconfirmed 1300 analytical test result and should not be used for non-medical 1400 purposes. Clinical consideration and professional judgment should 1500 be applied to any positive drug screen result due to possible 1600 interfering substances. A more specific alternate chemical method 1700 must be used in order to obtain a confirmed analytical result.  1800 Gas chromato graphy / mass spectrometry (GC/MS) is the preferred 1900 confirmatory method.   Urinalysis complete, with microscopic ([facility] only)     Status: Abnormal   Collection Time: 12/14/14  9:42 PM  Result Value Ref Range   Color, Urine YELLOW (A) YELLOW   APPearance HAZY (A) CLEAR   Glucose, UA NEGATIVE NEGATIVE mg/dL   Bilirubin Urine NEGATIVE NEGATIVE   Ketones, ur NEGATIVE NEGATIVE mg/dL   Specific Gravity, Urine 1.026 1.005 - 1.030   Hgb urine dipstick 2+ (A) NEGATIVE   pH 5.0 5.0 - 8.0   Protein, ur NEGATIVE NEGATIVE mg/dL   Nitrite POSITIVE (A) NEGATIVE   Leukocytes, UA TRACE (A) NEGATIVE   RBC / HPF 0-5 0 - 5 RBC/hpf   WBC, UA 6-30 0 - 5 WBC/hpf   Bacteria, UA MANY (A) NONE SEEN   Squamous Epithelial / LPF 6-30 (A) NONE SEEN   Mucous PRESENT   Pregnancy, urine     Status: None   Collection Time: 12/14/14  9:42 PM  Result Value Ref Range   Preg Test, Ur NEGATIVE NEGATIVE    Observation Level/Precautions:  15 minute checks  Laboratory:  per ED  Psychotherapy:  group  Medications:  As per medlist  Consultations:  As needed  Discharge Concerns:  safety  Estimated LOS: 2-7 days  Other:     Psychological Evaluations: Yes    Treatment Plan Summary: 1.  Take all your medications as prescribed.              2.  Report any adverse side effects to outpatient provider.  3.  Patient instructed to not use alcohol or illegal drugs while on prescription medicines.            4.  In the event of worsening symptoms, instructed patient to call 911, the crisis hotline or go to nearest emergency room for evaluation of symptoms.  Medical Decision Making:  Review of Psycho-Social Stressors (1), Review or order clinical lab tests (1), Discuss test with performing physician (1), Decision to obtain old records (1), Review and summation of old records (2) and Review of Medication Regimen & Side Effects (2)  I certify that inpatient services furnished can reasonably be expected to improve the patient's condition.   Freda Munro May Agustin AGNP-BC 8/17/20165:42 PM  Case reviewed with NP and patient seen by me Agree with NP Note, assessment 64 year old separated female. On SSI. Lives with a roommate. Has 4 children , ranging in ages from 56 yo to 55 yo. The children are currently with father. Reports history of substance dependence. Substance of choice is opiate analgesics. Denies IVDA. Had been taking 8-10 percocets per day " just to function".  Denies alcohol or BZD abuse/dependence. Minimizes psychiatric history- states she sometimes feels depressed due to drug abuse , denies history of suicide attempts, denies history of self cutting, denies history of psychosis, denies history of mania, denies history of PTSD, denies history of violence . Medical History remarkable For Asthma,for history of tubal ligation. Smokes 1/2 PPD, NKDA.  Parents alive, separated, has one brother, denies history of mental illness, drug abuse, or suicides in family. Currently minimzies neuro-vegetative symptoms of depression- does endorse poor energy and decreased sense of self esteem, but denies anhedonia or protracted sadness . Denies  psychotic symptoms or any HI.  Currently denies symptoms of opiate WDL- does not endorse cramping, muscular aches, rhinorrhea, nausea, vomiting and does not appear to be in any acute distress . Dx- Opiate Dependence, Opiate Induced Mood Disorder, Opiate Withdrawal, currently mild . Plan- admit to inpatient psychiatric unit. At this time being detoxed from opiates with Clonidine. We discussed starting antidepressant medication - not interested at this time.  UA suggestive of UTI- will initiate management.

## 2014-12-16 NOTE — Progress Notes (Signed)
Patient slept until mid morning. Now up and visible in milieu. Interacting appropriately with peers and staff. Denying all withdrawal symptoms and does not wish to take clonidine. BP also does not meet parameters. Patient presents flat, anxious. Rates depression at a 2/10 and denies hopelessness and anxiety.  States her goal for today is to "stay off drugs." Patient's only complaint is low back pain. Naproxen given which reduced pain from a 7/10 to a 4/10. Robaxin also given with some decrease in spasms. Patient offered support, encouragement. She denies SI/HI and remains safe. Lawrence [place]

## 2014-12-16 NOTE — Plan of Care (Signed)
Problem: Ineffective individual coping Goal: STG: Patient will remain free from self harm Outcome: Progressing Patient denying SI, thoughts/acts of self harm.  Problem: Diagnosis: Increased Risk For Suicide Attempt Goal: STG-Patient Will Comply With Medication Regime Outcome: Progressing Patient med compliant. She has refused clonidine as she is not experiencing withdrawal symptoms. (BP also does not meet parameters.)

## 2014-12-16 NOTE — [facility] Group Notes (Signed)
[facility] LCSW Group Therapy 12/16/2014 1:15 PM Type of Therapy: Group Therapy Participation Level: Minimal  Participation Quality: Minimal  Affect: Depressed and Flat  Cognitive: Alert and Oriented  Insight: Developing/Improving and Engaged  Engagement in Therapy: Developing/Improving and Engaged  Modes of Intervention: Clarification, Confrontation, Discussion, Education, Exploration, Limit-setting, Orientation, Problem-solving, Rapport Building, Dance movement psychotherapist, Socialization and Support  Summary of Progress/Problems: The topic for group today was emotional regulation. This group focused on both positive and negative emotion identification and allowed group members to process ways to identify feelings, regulate negative emotions, and find healthy ways to manage internal/external emotions. Group members were asked to reflect on a time when their reaction to an emotion led to a negative outcome and explored how alternative responses using emotion regulation would have benefited them. Group members were also asked to discuss a time when emotion regulation was utilized when a negative emotion was experienced. Patient participated minimally in group discussion but was observed actively listening.   Samuella Bruin, MSW, [employer] Clinical Social Worker [facility] [phone]

## 2014-12-16 NOTE — [facility] Group Notes (Signed)
   [facility] LCSW Aftercare Discharge Planning Group Note  12/16/2014  8:45 AM   Participation Quality: Alert, Appropriate and Oriented  Mood/Affect: Depressed and Flat  Depression Rating: 2-3  Anxiety Rating: 5  Thoughts of Suicide: Pt denies SI/HI  Will you contract for safety? Yes  Current AVH: Pt denies  Plan for Discharge/Comments: Pt attended discharge planning group and actively participated in group. CSW provided pt with today's workbook. Patient is unsure of living situation at discharge and has no outpatient providers.   Transportation Means: Pt reports access to transportation  Supports: No supports mentioned at this time  Samuella Bruin, MSW, [employer] Clinical Social Worker [employer] [phone]

## 2014-12-17 MED ORDER — CITALOPRAM HYDROBROMIDE 20 MG PO TABS
20.0000 mg | ORAL_TABLET | Freq: Every day | ORAL | Status: DC
  Administered 2014-12-17 – 2014-12-18 (×2): 20 mg via ORAL
  Filled 2014-12-17 (×4): qty 1
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
## 2014-12-17 NOTE — Clinical Social Work Note (Signed)
Referral faxed to [facility] at patient's request.   Itzae Mccurdy, MSW, LCSWA Clinical Social Worker [facility] [phone]  

## 2014-12-17 NOTE — Tx Team (Addendum)
Interdisciplinary Treatment Plan Update (Adult) Date: 12/17/2014    Time Reviewed: 9:30 AM  Progress in Treatment: Attending groups: Continuing to assess, patient new to milieu Participating in groups: Continuing to assess, patient new to milieu Taking medication as prescribed: Yes Tolerating medication: Yes Family/Significant other contact made: No, CSW assessing for appropriate contacts Patient understands diagnosis: Yes Discussing patient identified problems/goals with staff: Yes Medical problems stabilized or resolved: Yes Denies suicidal/homicidal ideation: Yes Issues/concerns per patient self-inventory: Yes Other:  New problem(s) identified: N/A  Discharge Plan or Barriers: 12/17/2014:  CSW continuing to assess, patient new to milieu.  Reason for Continuation of Hospitalization:  Depression Anxiety Medication Stabilization   Comments: N/A  Estimated length of stay: 2-3 days   Patient is a 34 year old female admitted for depression and polysubstance abuse. Patient will benefit from crisis stabilization, medication evaluation, group therapy, and psycho education in addition to case management for discharge planning. Patient and CSW reviewed pt's identified goals and treatment plan. Pt verbalized understanding and agreed to treatment plan.     Review of initial/current patient goals per problem list:  1. Goal(s): Patient will participate in aftercare plan   Met: Yes   Target date: 3-5 days post admission date   As evidenced by: Patient will participate within aftercare plan AEB aftercare provider and housing plan at discharge being identified.  12/17/2014: Goal not met: CSW assessing for appropriate referrals for pt and will have follow up secured prior to d/c. 8/19: Goal met: Patient plans to return home to follow up with [facility] until [facility] bed becomes available.    2. Goal (s): Patient will exhibit decreased depressive symptoms and suicidal ideations.   Met:  Yes   Target date: 3-5 days post admission date   As evidenced by: Patient will utilize self rating of depression at 3 or below and demonstrate decreased signs of depression or be deemed stable for discharge by MD.  8/17: Goal met: Patient rates depression at 2-3 and denies SI.    3. Goal(s): Patient will demonstrate decreased signs and symptoms of anxiety.   Met: Yes   Target date: 3-5 days post admission date   As evidenced by: Patient will utilize self rating of anxiety at 3 or below and demonstrated decreased signs of anxiety, or be deemed stable for discharge by MD  8/17: Patient rates anxiety at 5 today. 8/19: Goal met: Patient rates anxiety at 1 today.   4. Goal(s): Patient will demonstrate decreased signs of withdrawal due to substance abuse   Met: Yes   Target date: 3-5 days post admission date   As evidenced by: Patient will produce a CIWA/COWS score of 0, have stable vitals signs, and no symptoms of withdrawal  12/17/2014: Goal met: No withdrawal symptoms reported at this time per medical chart.    Attendees: Patient:    Family:    Physician: Dr. Parke Poisson; Dr. Sabra Heck 12/17/2014 9:30 AM  Nursing: Mayra Neer, Patty Duke, [facility] 12/17/2014 9:30 AM  Clinical Social Worker: Tilden Fossa,  [place] 12/17/2014 9:30 AM  Other: Peri Maris, [place], LCSWA 12/17/2014 9:30 AM  Other:  12/17/2014 9:30 AM  Other:  12/17/2014 9:30 AM  Other: Samuel Jester, NP 12/17/2014 9:30 AM  Other:    Other:    Other:      Scribe for Treatment Team:  Tilden Fossa, MSW, [place] [phone]

## 2014-12-17 NOTE — BHH Suicide Risk Assessment (Signed)
[facility] INPATIENT:  Family/Significant Other Suicide Prevention Education  Suicide Prevention Education:  Patient Refusal for Family/Significant Other Suicide Prevention Education: The patient Victoria Dorsey has refused to provide written consent for family/significant other to be provided Family/Significant Other Suicide Prevention Education during admission and/or prior to discharge.  Physician notified. SPE reviewed with patient and brochure provided. Patient encouraged to return to hospital if having suicidal thoughts, patient verbalized his/her understanding and has no further questions at this time.   Victoria Dorsey, [place] 12/17/2014, 12:35 PM

## 2014-12-17 NOTE — Plan of Care (Signed)
Problem: Alteration in mood; excessive anxiety as evidenced by: Goal: STG-Pt will report an absence of self-harm thoughts/actions (Patient will report an absence of self-harm thoughts or actions)  Outcome: Progressing Patient denying SI, self harm thoughts.  Problem: Diagnosis: Increased Risk For Suicide Attempt Goal: STG-Patient Will Attend All Groups On The Unit Outcome: Not Progressing Patient has attended few groups. Participation is minimal.

## 2014-12-17 NOTE — B[facility] Group Notes (Signed)
[facility] Group Notes:  (Nursing/MHT/Case Management/Adjunct)  Date:  12/17/2014  Time:  0900  Type of Therapy:  Nurse Education  Participation Level:  Minimal  Participation Quality:  Attentive  Affect:  Flat  Cognitive:  Oriented  Insight:  Lacking  Engagement in Group:  Lacking  Modes of Intervention:  Education  Summary of Progress/Problems:  Educated patient on goal setting and making positive changes. Also being mindful of self esteem and negative self talk that can sabotage recovery.     Merian Capron [facility] 12/17/2014, 0930

## 2014-12-17 NOTE — Progress Notes (Signed)
Nutrition Education Note  RD led a group providing general, healthful nutrition education.  RD emphasized the importance of eating regular meals and snacks throughout the day. Consuming sugar-free beverages and incorporating fruits and vegetables into diet when possible. Provided examples of healthy snacks. Encouraged physical activity for at least 60 minutes a day. Patient encouraged to leave group with a goal to improve nutrition/healthy eating.   Expect good compliance.  Diet Order: Diet Heart Room service appropriate?: Yes; Fluid consistency:: Thin Pt is also offered choice of unit snacks mid-morning and mid-afternoon.  Pt is eating as desired.   Labs and medications reviewed. If additional nutrition issues arise, please consult RD.  Victoria Santana, MS, RD, LDN Pager: [phone] After Hours Pager: [phone]     

## 2014-12-17 NOTE — Progress Notes (Addendum)
[facility] MD Progress Note  12/17/2014 8:43 AM Victoria Dorsey  MRN:  [id number] Subjective:   Patient states she is doing " fine". At this time minimizes any symptoms. States she does not feel depressed, does not feel sad, and states she is not experiencing any opiate withdrawal symptoms. She denies medication side effects. Of note, although she denies depression, does state she has a long history of " anxiety" and worrying excessively, which she feels has contributed to her substance abuse in the past . Objective: I have discussed case with treatment team and have met with patient. As noted, patient has been minimizing any ongoing symptoms and has denied any significant depression at this time. Staff reports, however, that patient's mood and affect still appear depressed/constricted/flat at times. Patient denies any violent ideations towards anyone or any SI. States that prior to admission she had been irritable and " lashing out" ( verbally ) at people, because of her drug abuse. Now that she is sober, she does not feel irritable any longer . As noted, patient does endorse long history of anxiety at this time. Visible on unit, no disruptive behaviors in milieu. Patient hoping for discharge soon. Denies medication side effects . Of note, denies any cravings for drugs/opiates at this time. Does not endorse nausea, vomiting, diarrhea, aches, myalgias, rhinorrhea, malaise or any significant symptoms of opiate WDL- her vitals are stable.  Principal Problem: Opioid dependence with opioid-induced mood disorder Diagnosis:   Patient Active Problem List   Diagnosis Date Noted  . Opioid dependence with opioid-induced mood disorder [F11.24]   . Polysubstance abuse [F19.10] 12/15/2014   Total Time spent with patient: 20 minutes   Past Medical History:  Past Medical History  Diagnosis Date  . Asthma   . Anxiety   . Depression     Past Surgical History  Procedure Laterality Date  . Cesarean  section     Family History: History reviewed. No pertinent family history. Social History:  History  Alcohol Use  . 0.6 oz/week  . 1 Shots of liquor per week    Comment: special occasion, once monthly     History  Drug Use  . Yes  . Special: Cocaine, Other-see comments    Comment: cocaine, oiates, percocet    Social History   Social History  . Marital Status: Married    Spouse Name: N/A  . Number of Children: N/A  . Years of Education: N/A   Social History Main Topics  . Smoking status: Current Every Day Smoker -- 1.00 packs/day for 20 years    Types: Cigarettes  . Smokeless tobacco: None  . Alcohol Use: 0.6 oz/week    1 Shots of liquor per week     Comment: special occasion, once monthly  . Drug Use: Yes    Special: Cocaine, Other-see comments     Comment: cocaine, oiates, percocet  . Sexual Activity: Yes    Birth Control/ Protection: Other-see comments     Comment: tubal ligation   Other Topics Concern  . None   Social History Narrative   Additional History:    Sleep: improved  Appetite:  Good   Assessment:   Musculoskeletal: Strength & Muscle Tone: within normal limits- no tremors, no diaphoresis, no restlessness or psychomotor agitation.  Gait & Station: normal Patient leans: N/A   Psychiatric Specialty Exam: Physical Exam  ROS denies headache, denies nausea , denies vomiting, denies diarrhea, denies muscle aches or cramps at this time. At this time also denies  dysuria or urgency or chills .  Blood pressure 118/68, pulse 62, temperature 98.2 F (36.8 C), temperature source Oral, resp. rate 18, height 5' 1"  (1.549 m), weight 132 lb (59.875 kg), last menstrual period 12/03/2014, SpO2 99 %.Body mass index is 24.95 kg/(m^2).  General Appearance: Fairly Groomed  Engineer, water::  Good  Speech:  Normal Rate  Volume:  Normal  Mood:  denies depression and states mood is " fine "  Affect:  still presents with a flat, somewhat constricted affect  Thought  Process:  Linear  Orientation:  Other:  fully alert and attentive   Thought Content:  no hallucinations, no delusions  Suicidal Thoughts:  No- at this time denies any thoughts of hurting self, of SI,  Or of hurting  anyone else   Homicidal Thoughts:  No  Memory:  recent and remote grossly intact   Judgement:  Fair  Insight:  Fair  Psychomotor Activity:  Normal  Concentration:  Good  Recall:  Good  Fund of Knowledge:Good  Language: Good  Akathisia:  Negative  Handed:  Right  AIMS (if indicated):     Assets:  Resilience  ADL's:  Improved   Cognition: WNL  Sleep:  Number of Hours: 6     Current Medications: Current Facility-Administered Medications  Medication Dose Route Frequency Provider Last Rate Last Dose  . acetaminophen (TYLENOL) tablet 650 mg  650 mg Oral Q6H PRN Niel Hummer, NP      . alum & mag hydroxide-simeth (MAALOX/MYLANTA) 200-200-20 MG/5ML suspension 30 mL  30 mL Oral Q4H PRN Niel Hummer, NP      . cephALEXin (KEFLEX) capsule 500 mg  500 mg Oral Q12H Kerrie Buffalo, NP   500 mg at 12/17/14 0816  . cloNIDine (CATAPRES) tablet 0.1 mg  0.1 mg Oral QID Niel Hummer, NP   Stopped at 12/16/14 0800   Followed by  . [START ON 12/18/2014] cloNIDine (CATAPRES) tablet 0.1 mg  0.1 mg Oral BH-qamhs Niel Hummer, NP       Followed by  . [START ON 12/20/2014] cloNIDine (CATAPRES) tablet 0.1 mg  0.1 mg Oral QAC breakfast Niel Hummer, NP      . dicyclomine (BENTYL) tablet 20 mg  20 mg Oral Q6H PRN Niel Hummer, NP      . hydrOXYzine (ATARAX/VISTARIL) tablet 25 mg  25 mg Oral Q6H PRN Niel Hummer, NP   25 mg at 12/16/14 1627  . loperamide (IMODIUM) capsule 2-4 mg  2-4 mg Oral PRN Niel Hummer, NP      . magnesium hydroxide (MILK OF MAGNESIA) suspension 30 mL  30 mL Oral Daily PRN Niel Hummer, NP      . methocarbamol (ROBAXIN) tablet 500 mg  500 mg Oral Q8H PRN Niel Hummer, NP   500 mg at 12/16/14 2152  . naproxen (NAPROSYN) tablet 500 mg  500 mg Oral BID PRN Niel Hummer, NP   500 mg at 12/16/14 1951  . nicotine (NICODERM CQ - dosed in mg/24 hours) patch 21 mg  21 mg Transdermal Daily Laverle Hobby, PA-C   21 mg at 12/17/14 0817  . ondansetron (ZOFRAN-ODT) disintegrating tablet 4 mg  4 mg Oral Q6H PRN Niel Hummer, NP      . traZODone (DESYREL) tablet 50 mg  50 mg Oral QHS PRN Niel Hummer, NP   50 mg at 12/16/14 2152    Lab Results: No results found for this or  any previous visit (from the past 48 hour(s)).  Physical Findings: AIMS: Facial and Oral Movements Muscles of Facial Expression: None, normal Lips and Perioral Area: None, normal Jaw: None, normal Tongue: None, normal,Extremity Movements Upper (arms, wrists, hands, fingers): None, normal Lower (legs, knees, ankles, toes): None, normal, Trunk Movements Neck, shoulders, hips: None, normal, Overall Severity Severity of abnormal movements (highest score from questions above): None, normal Incapacitation due to abnormal movements: None, normal Patient's awareness of abnormal movements (rate only patient's report): No Awareness, Dental Status Current problems with teeth and/or dentures?: Yes (waiting for dentures) Does patient usually wear dentures?: No  CIWA:  CIWA-Ar Total: 2 COWS:  COWS Total Score: 0   Assessment- at this time patient reporting much improvement , focused on being discharged soon. She is denying depression, denying neuro-vegetative symptoms of depression. She does, however, still present with a somewhat constricted affect, and is endorsing a history of anxiety, excessive worrying, anxious ruminations. We discussed medication options and she agrees to SSRI ( Celexa ) trial to address anxiety. She is not presenting with any opiate WDL symptoms, even though she has not been taking clonidine . Denies medication side effects.   Treatment Plan Summary: Daily contact with patient to assess and evaluate symptoms and progress in treatment, Medication management, Plan inpatient  admission and medications as below Clonidine taper to minimize symptoms of opiate withdrawal- (at this time patient not accepting the medication as states she does not have any symptoms of WDL)  Continue medications for symptomatic relief of opiate WDL:  Bentyl, Imodium, Robaxin if needed .  Patient denies depression,  But endorsing chronic anxiety- agrees to start Celexa 20 mgrs QDAY to manage anxiety disorder . Continue Trazodone 50 mgrs QHS PRN for insomnia as needed  Continue Nicoderm Patch to address cigarette/nicotine cravings Continue Keflex for UTI.  Medical Decision Making:  Established Problem, Stable/Improving (1), Review of Psycho-Social Stressors (1), Review or order clinical lab tests (1) and Review of Medication Regimen & Side Effects (2)     Atthew Coutant 12/17/2014, 8:43 AM

## 2014-12-17 NOTE — Progress Notes (Signed)
Pt reports she is doing fine and denies SI/HI/AVH.  She says that she was told she would be discharged tomorrow, and wanted to know when and if she would have transportation back to [place].  Pt was informed that there are no orders nor notes stating that she will be discharged on Thursday.  She was also encouraged to discuss her transportation issue with her CSW.  Pt continues to c/o back pain and receives prn meds for this.  She was also reminded that she is being treated for a UTI and that may be the source of her back pain.  Pt voiced understanding.  She makes her needs known to staff.  Support and encouragement offered.  Safety maintained with q15 minute checks.

## 2014-12-17 NOTE — [facility] Group Notes (Signed)
[facility] LCSW Group Therapy 12/17/2014 1:15 PM Type of Therapy: Group Therapy Participation Level: Active  Participation Quality: Attentive, Sharing and Supportive  Affect: Depressed and Flat  Cognitive: Alert and Oriented  Insight: Developing/Improving and Engaged  Engagement in Therapy: Developing/Improving and Engaged  Modes of Intervention: Activity, Clarification, Confrontation, Discussion, Education, Exploration, Limit-setting, Orientation, Problem-solving, Rapport Building, Reality Testing, Socialization and Support  Summary of Progress/Problems: Patient was attentive and engaged with speaker from [employer]. Patient was attentive to speaker while they shared their story of dealing with mental health and overcoming it. Patient expressed interest in their programs and services and received information on their agency. Patient processed ways they can relate to the speaker.   Lowery Paullin, MSW, LCSWA Clinical Social Worker [facility] [phone]   

## 2014-12-17 NOTE — Progress Notes (Signed)
Patient up and visible in milieu. Interacts at times though mostly keeps to self. Affect remains flat, mood depressed though patient rates depression, hopelessness and anxiety all at a 0/10. Rates chronic back pain at only 1/10. No other physical complaints and denies withdrawal. Refusing clonidine as she states she does not need it. Patient states her goal is to "go home to her children." Patient medicated per orders. Support offered. Denies SI/HI and remains safe. Lawrence [place]

## 2014-12-17 NOTE — [facility] Suicide Risk Assessment (Signed)
[facility] INPATIENT:  Family/Significant Other Suicide Prevention Education  Suicide Prevention Education:  Education Completed; Mother [name] [phone],  (name of family member/significant other) has been identified by the patient as the family member/significant other with whom the patient will be residing, and identified as the person(s) who will aid the patient in the event of a mental health crisis (suicidal ideations/suicide attempt).  With written consent from the patient, the family member/significant other has been provided the following suicide prevention education, prior to the and/or following the discharge of the patient.  The suicide prevention education provided includes the following:  Suicide risk factors  Suicide prevention and interventions  National Suicide Hotline telephone number  [facility] assessment telephone number  [facility] Emergency Assistance 911  [facility] and/or Residential Mobile Crisis Unit telephone number  Request made of family/significant other to:  Remove weapons (e.g., guns, rifles, knives), all items previously/currently identified as safety concern.    Remove drugs/medications (over-the-counter, prescriptions, illicit drugs), all items previously/currently identified as a safety concern.  The family member/significant other verbalizes understanding of the suicide prevention education information provided.  The family member/significant other agrees to remove the items of safety concern listed above.  Victoria Dorsey, [place] 12/17/2014, 2:55 PM

## 2014-12-17 NOTE — Progress Notes (Signed)
Patient signed 72 hour RFD form. Victoria Dorsey

## 2014-12-18 MED ORDER — CEPHALEXIN 500 MG PO CAPS: 500.0000 mg | ORAL_CAPSULE | Freq: Two times a day (BID) | ORAL | Status: DC

## 2014-12-18 MED ORDER — CITALOPRAM HYDROBROMIDE 20 MG PO TABS: 20.0000 mg | ORAL_TABLET | Freq: Every day | ORAL | Status: DC

## 2014-12-18 NOTE — Progress Notes (Signed)
D: Per patient self inventory form patient reports she slept good with out the use of sleep medication. She reports a good appetite, normal energy level, good concentration. She rates depression 0/10, hopelessness 0/10, anxiety 0/10- all on 0-10 scale, 10 being the worse.  She denies physical pain. She denies withdrawal symptoms. She denies SI/HI. Denies AVH. "I feel great."  She reports her goal today is "getting home to my family."  A:Special checks q 15 mins in place for safety. Medication administered per MD order(see eMAR). Encouragement and support provided. COWS in place. Discharge planning in place.  R:Safety maintained. Will continue to monitor.

## 2014-12-18 NOTE — Progress Notes (Signed)
D: Patient alert and oriented x 4. Patient denies pain SI/HI/AVH.  A: Staff to monitor Q 15 mins for safety. Encouragement and support offered. Scheduled medications administered per orders. R: Patient remains safe on the unit. Patient attended group tonight. Patient visible on hte unit and interacting with peers. Patient taking administered medications.  

## 2014-12-18 NOTE — Progress Notes (Signed)
  [facility] Adult Case Management Discharge Plan :  Will you be returning to the same living situation after discharge:  Yes,  patient plans to return home At discharge, do you have transportation home?: Yes,  patient requesting transportation back to [facility] Do you have the ability to pay for your medications: Yes,  patient will be provided with medication samples and prescriptions  Release of information consent forms completed and in the chart;  Patient's signature needed at discharge.  Patient to Follow up at: Follow-up Information    Follow up with [facility].   Why:  Walk-in clinic Monday-Friday between 8am to 5pm for hospital follow up assessment for therapy and medication management services. Please arrive early in order to be seen.   Contact information:    [address] [place], [place] [address] Phone [phone] Fax: [phone]      Follow up with [facility].   Why:  Please call Shayla on Monday August 22nd to inquire about status of referral for residential treatment sent on 8/18.   Contact information:   [address] [place] [phone]      Patient denies SI/HI: Yes,  denies    Safety Planning and Suicide Prevention discussed: Yes,  with patient and mother  Have you used any form of tobacco in the last 30 days? (Cigarettes, Smokeless Tobacco, Cigars, and/or Pipes): Yes  Has patient been referred to the Quitline?: Yes, faxed on 12/18/14  Cari Vandeberg, [place] 12/18/2014, 9:54 AM

## 2014-12-18 NOTE — Discharge Summary (Signed)
Physician Discharge Summary Note  Patient:  Victoria Dorsey is an 34 y.o., female MRN:  [id number] DOB:  18-Jun-1980 Patient phone:  [phone] (home)  Patient address:   [address] [place] [address],  Total Time spent with patient: 45 minutes  Date of Admission:  12/15/2014 Date of Discharge: 12/18/2014  Reason for Admission:  Substance abuse  Principal Problem: Opioid dependence with opioid-induced mood disorder Discharge Diagnoses: Patient Active Problem List   Diagnosis Date Noted  . Opioid dependence with opioid-induced mood disorder [F11.24]   . Polysubstance abuse [F19.10] 12/15/2014    Musculoskeletal: Strength & Muscle Tone: within normal limits Gait & Station: normal Patient leans: N/A  Psychiatric Specialty Exam: Physical Exam  Vitals reviewed. Psychiatric: Her mood appears anxious. She does not exhibit a depressed mood. She expresses no homicidal and no suicidal ideation.    Review of Systems  All other systems reviewed and are negative.   Blood pressure 118/84, pulse 74, temperature 97.6 F (36.4 C), temperature source Oral, resp. rate 17, height 5\' 1"  (1.549 m), weight 59.875 kg (132 lb), last menstrual period 12/03/2014, SpO2 99 %.Body mass index is 24.95 kg/(m^2).   General Appearance: improved grooming   Eye Contact:: Good  Speech: Normal Rate409  Volume: Normal  Mood: improved, currently euthymic   Affect: Appropriate  Thought Process: Linear  Orientation: Full (Time, Place, and Person)  Thought Content: denies hallucinations, no delusions   Suicidal Thoughts: No- at this time denies any self injurious ideations or any SI  Homicidal Thoughts: No  Memory: recent and remote grossly intact   Judgement: Other: improved  Insight: Present  Psychomotor Activity: Normal  Concentration: Good  Recall: Good  Fund of Knowledge:Good  Language: Good  Akathisia: Negative  Handed: Right  AIMS (if  indicated):    Assets: Communication Skills Desire for Improvement Resilience  Sleep: Number of Hours: 6  Cognition: WNL  ADL's: improved       Have you used any form of tobacco in the last 30 days? (Cigarettes, Smokeless Tobacco, Cigars, and/or Pipes): Yes  Has this patient used any form of tobacco in the last 30 days? (Cigarettes, Smokeless Tobacco, Cigars, and/or Pipes) N/A  Past Medical History:  Past Medical History  Diagnosis Date  . Asthma   . Anxiety   . Depression     Past Surgical History  Procedure Laterality Date  . Cesarean section     Family History: History reviewed. No pertinent family history. Social History:  History  Alcohol Use  . 0.6 oz/week  . 1 Shots of liquor per week    Comment: special occasion, once monthly     History  Drug Use  . Yes  . Special: Cocaine, Other-see comments    Comment: cocaine, oiates, percocet    Social History   Social History  . Marital Status: Married    Spouse Name: N/A  . Number of Children: N/A  . Years of Education: N/A   Social History Main Topics  . Smoking status: Current Every Day Smoker -- 1.00 packs/day for 20 years    Types: Cigarettes  . Smokeless tobacco: None  . Alcohol Use: 0.6 oz/week    1 Shots of liquor per week     Comment: special occasion, once monthly  . Drug Use: Yes    Special: Cocaine, Other-see comments     Comment: cocaine, oiates, percocet  . Sexual Activity: Yes    Birth Control/ Protection: Other-see comments     Comment: tubal  ligation   Other Topics Concern  . None   Social History Narrative   Risk to Self: Is patient at risk for suicide?: No What has been your use of drugs/alcohol within the last 12 months?: Using percocet daily 8-10 5 or s and cocaine every several months Risk to Others:   Prior Inpatient Therapy:   Prior Outpatient Therapy:    Level of Care:  OP  Hospital Course:  [place] was admitted for Opioid dependence with  opioid-induced mood disorder and crisis management.  She was treated discharged with the medications listed below under Medication List.  Medical problems were identified and treated as needed.  Home medications were restarted as appropriate.  Improvement was monitored by observation and Donette Larry daily report of symptom reduction.  Emotional and mental status was monitored by daily self-inventory reports completed by Donette Larry and clinical staff.         Rosita Kea Berkland was evaluated by the treatment team for stability and plans for continued recovery upon discharge.  Rosita Kea Redford motivation was an integral factor for scheduling further treatment.  Employment, transportation, bed availability, health status, family support, and any pending legal issues were also considered during her hospital stay.  She was offered further treatment options upon discharge including but not limited to Residential, Intensive Outpatient, and Outpatient treatment.  Rosita Kea Maves will follow up with the services as listed below under Follow Up Information.     Upon completion of this admission the patient was both mentally and medically stable for discharge denying suicidal/homicidal ideation, auditory/visual/tactile hallucinations, delusional thoughts and paranoia.      Consults:  psychiatry  Significant Diagnostic Studies:  labs: per ED  Discharge Vitals:   Blood pressure 118/84, pulse 74, temperature 97.6 F (36.4 C), temperature source Oral, resp. rate 17, height  (1.549 m), weight 59.875 kg (132 lb), last menstrual period 12/03/2014, SpO2 99 %. Body mass index is 24.95 kg/(m^2). Lab Results:   No results found for this or any previous visit (from the past 72 hour(s)).  Physical Findings: AIMS: Facial and Oral Movements Muscles of Facial Expression: None, normal Lips and Perioral Area: None, normal Jaw: None, normal Tongue: None, normal,Extremity Movements Upper  (arms, wrists, hands, fingers): None, normal Lower (legs, knees, ankles, toes): None, normal, Trunk Movements Neck, shoulders, hips: None, normal, Overall Severity Severity of abnormal movements (highest score from questions above): None, normal Incapacitation due to abnormal movements: None, normal Patient's awareness of abnormal movements (rate only patient's report): No Awareness, Dental Status Current problems with teeth and/or dentures?: Yes Does patient usually wear dentures?: No  CIWA:  CIWA-Ar Total: 2 COWS:  COWS Total Score: 0   See Psychiatric Specialty Exam and Suicide Risk Assessment completed by Attending Physician prior to discharge.  Discharge destination:  Home  Is patient on multiple antipsychotic therapies at discharge:  No   Has Patient had three or more failed trials of antipsychotic monotherapy by history:  No    Recommended Plan for Multiple Antipsychotic Therapies: NA     Medication List    STOP taking these medications        SUBOXONE 8-2 MG Film  Generic drug:  Buprenorphine HCl-Naloxone HCl      TAKE these medications      Indication   cephALEXin 500 MG capsule  Commonly known as:  KEFLEX  Take 1 capsule (500 mg total) by mouth every 12 (twelve) hours.   Indication:  Urinary Tract  Infection     citalopram 20 MG tablet  Commonly known as:  CELEXA  Take 1 tablet (20 mg total) by mouth daily.   Indication:  Depression       Follow-up Information    Follow up with [facility].   Why:  Walk-in clinic Monday-Friday between 8am to 5pm for hospital follow up assessment for therapy and medication management services. Please arrive early in order to be seen.   Contact information:    [address] [place], [place] [address] Phone [phone] Fax: [phone]      Follow up with [facility].   Why:  Please call Shayla on Monday August 22nd to inquire about status of referral for residential treatment sent on 8/18.   Contact  information:   [address] [place] [phone]      Follow-up recommendations:  Activity:  as tol, diet as tol  Comments:  1.  Take all your medications as prescribed.              2.  Report any adverse side effects to outpatient provider.                       3.  Patient instructed to not use alcohol or illegal drugs while on prescription medicines.            4.  In the event of worsening symptoms, instructed patient to call 911, the crisis hotline or go to nearest emergency room for evaluation of symptoms.  Total Discharge Time:  40 min  Signed: Velna Hatchet May Agustin AGNP-BC 12/18/2014, 6:17 PM   Patient seen, Suicide Assessment Completed.  Disposition Plan Reviewed

## 2014-12-18 NOTE — Clinical Social Work Note (Signed)
CSW spoke with mother [name] to provide update on discharge plans.  CSW left voicemail for CPS social worker Marshell Garfinkel [phone] with patient's permission to discuss discharge plans. Awaiting return call.  Samuella Bruin, MSW, [employer] Clinical Social Worker [facility] [phone]

## 2014-12-18 NOTE — [facility] Group Notes (Signed)
   [facility] LCSW Aftercare Discharge Planning Group Note  12/18/2014  8:45 AM   Participation Quality: Alert, Appropriate and Oriented  Mood/Affect: Anxious  Depression Rating: 0  Anxiety Rating: 1  Thoughts of Suicide: Pt denies SI/HI  Will you contract for safety? Yes  Current AVH: Pt denies  Plan for Discharge/Comments: Pt attended discharge planning group and actively participated in group. CSW provided pt with today's workbook. Patient reports feeling ready to discharge. She plans to return home to follow up with [facility] to await bed availability at [facility].   Transportation Means: Pt reports access to transportation  Supports: No supports mentioned at this time  Samuella Bruin, MSW, [employer] Clinical Social Worker [employer] [phone]

## 2014-12-18 NOTE — [facility] Suicide Risk Assessment (Signed)
[facility] Discharge Suicide Risk Assessment   Demographic Factors:  34 year old female.   Total Time spent with patient: 30 minutes  Musculoskeletal: Strength & Muscle Tone: within normal limits Gait & Station: normal Patient leans: N/A  Psychiatric Specialty Exam: Physical Exam  ROS  Blood pressure 118/84, pulse 74, temperature 97.6 F (36.4 C), temperature source Oral, resp. rate 17, height 5\' 1"  (1.549 m), weight 132 lb (59.875 kg), last menstrual period 12/03/2014, SpO2 99 %.Body mass index is 24.95 kg/(m^2).  General Appearance: improved grooming   Eye Contact::  Good  Speech:  Normal Rate409  Volume:  Normal  Mood:  improved, currently euthymic   Affect:  Appropriate  Thought Process:  Linear  Orientation:  Full (Time, Place, and Person)  Thought Content:  denies hallucinations, no delusions   Suicidal Thoughts:  No- at this time denies any self injurious ideations or any SI  Homicidal Thoughts:  No  Memory:  recent and remote grossly intact   Judgement:  Other:  improved  Insight:  Present  Psychomotor Activity:  Normal  Concentration:  Good  Recall:  Good  Fund of Knowledge:Good  Language: Good  Akathisia:  Negative  Handed:  Right  AIMS (if indicated):     Assets:  Communication Skills Desire for Improvement Resilience  Sleep:  Number of Hours: 6  Cognition: WNL  ADL's:  improved   Have you used any form of tobacco in the last 30 days? (Cigarettes, Smokeless Tobacco, Cigars, and/or Pipes): Yes  Has this patient used any form of tobacco in the last 30 days? (Cigarettes, Smokeless Tobacco, Cigars, and/or Pipes) Yes, A prescription for an FDA-approved tobacco cessation medication was offered at discharge and the patient refused  Mental Status Per Nursing Assessment::   On Admission:     Current Mental Status by Physician: At this time patient is alert and attentive, well related, pleasant, calm, mood euthymic, denies depression, affect appropriate, no thought  disorder, no SI or HI, no psychotic symptoms, future oriented.  Loss Factors: Separation, does not have custody of her children at this time- they are with their father, self consciousness related to being edentulous.    Historical Factors: History of Opiate Dependence   Risk Reduction Factors:   Sense of responsibility to family and Positive coping skills or problem solving skills  Continued Clinical Symptoms:  As noted, at this time patient much improved, presenting euthymic, with full range of affect, no SI or HI, future oriented .  Denies medication side effects  Cognitive Features That Contribute To Risk:  No gross cognitive deficits noted upon discharge. Is alert , attentive, and oriented x 3     Suicide Risk:  Mild:  Suicidal ideation of limited frequency, intensity, duration, and specificity.  There are no identifiable plans, no associated intent, mild dysphoria and related symptoms, good self-control (both objective and subjective assessment), few other risk factors, and identifiable protective factors, including available and accessible social support.  Principal Problem: Opioid dependence with opioid-induced mood disorder Discharge Diagnoses:  Patient Active Problem List   Diagnosis Date Noted  . Opioid dependence with opioid-induced mood disorder [F11.24]   . Polysubstance abuse [F19.10] 12/15/2014    Follow-up Information    Follow up with [facility].   Why:  Walk-in clinic Monday-Friday between 8am to 5pm for hospital follow up assessment for therapy and medication management services. Please arrive early in order to be seen.   Contact information:    [address] [place], [place]  [address] Phone [phone] Fax: [phone]      Follow up with [facility].   Why:  Please call Shayla on Monday August 22nd to inquire about status of referral for residential treatment sent on 8/18.   Contact information:   [address] [place]  [place] [phone]      Plan Of Care/Follow-up recommendations:  Activity:  as tolerated  Diet:  Regular  Tests:  NA Other:  see below  Is patient on multiple antipsychotic therapies at discharge:  No   Has Patient had three or more failed trials of antipsychotic monotherapy by history:  No  Recommended Plan for Multiple Antipsychotic Therapies: NA  Patient is leaving in good spirits. Plans to stay with supportive family members until she goes to [facility] residential rehabilitation setting on Monday. Plans to follow up at [facility], and has a PCP - Dr. Kenard Gower in [place], for medical issues as needed. Interested in going to [employer] to see if she can get  Affordable dentures  Encouraged to attend 12 step meetings .    Rubi Tooley 12/18/2014, 11:01 AM

## 2014-12-18 NOTE — Clinical Social Work Note (Addendum)
Patient reports that her father can provide transportation at 12:30. RN and NP made aware.   Samuella Bruin, MSW, [employer] Clinical Social Worker [facility] [phone]

## 2014-12-18 NOTE — Progress Notes (Signed)
Discharge note: Pt discharged per MD order. Pt reports she is eager to go home. Discharge summary reviewed with pt. Pt verbalizes understanding of discharge. Medication regimen reviewed with pt. Pt received rx and sample medication. All items returned to pt from locker # 59. Pt verbalizes and signs that she received all items from locker. She denies SI/HI at discharge.Denies AVH. Ambulatory out of facility

## 2015-05-17 ENCOUNTER — Emergency Department
Admission: EM | Admit: 2015-05-17 | Discharge: 2015-05-17 | Disposition: A | Discharge status: Home / Self Care | Payer: Medicaid Other | Attending: Emergency Medicine | Admitting: Emergency Medicine

## 2015-05-17 ENCOUNTER — Emergency Department: Payer: Medicaid Other

## 2015-05-17 ENCOUNTER — Encounter: Payer: Self-pay | Admitting: Emergency Medicine

## 2015-05-17 DIAGNOSIS — Z3202 Encounter for pregnancy test, result negative: Secondary | ICD-10-CM | POA: Insufficient documentation

## 2015-05-17 DIAGNOSIS — Z79899 Other long term (current) drug therapy: Secondary | ICD-10-CM | POA: Insufficient documentation

## 2015-05-17 DIAGNOSIS — F17210 Nicotine dependence, cigarettes, uncomplicated: Secondary | ICD-10-CM | POA: Insufficient documentation

## 2015-05-17 DIAGNOSIS — Z765 Malingerer [conscious simulation]: Secondary | ICD-10-CM | POA: Insufficient documentation

## 2015-05-17 DIAGNOSIS — R109 Unspecified abdominal pain: Secondary | ICD-10-CM | POA: Insufficient documentation

## 2015-05-17 DIAGNOSIS — Z792 Long term (current) use of antibiotics: Secondary | ICD-10-CM | POA: Diagnosis not present

## 2015-05-17 LAB — BASIC METABOLIC PANEL
Anion gap: 11 (ref 5–15)
BUN: 8 mg/dL (ref 6–20)
CALCIUM: 9.2 mg/dL (ref 8.9–10.3)
CO2: 25 mmol/L (ref 22–32)
CREATININE: 0.8 mg/dL (ref 0.44–1.00)
Chloride: 105 mmol/L (ref 101–111)
GFR calc Af Amer: >60 mL/min (ref >60)
GLUCOSE: 102 mg/dL — AB (ref 65–99)
POTASSIUM: 3.4 mmol/L — AB (ref 3.5–5.1)
Sodium: 141 mmol/L (ref 135–145)

## 2015-05-17 LAB — URINALYSIS COMPLETE WITH MICROSCOPIC (ARMC ONLY)
BILIRUBIN URINE: NEGATIVE
Glucose, UA: NEGATIVE mg/dL
KETONES UR: NEGATIVE mg/dL
Nitrite: NEGATIVE
PH: 6 (ref 5.0–8.0)
PROTEIN: NEGATIVE mg/dL
Specific Gravity, Urine: 1.009 (ref 1.005–1.030)

## 2015-05-17 LAB — CBC WITH DIFFERENTIAL/PLATELET
BASOS ABS: 0 10*3/uL (ref 0–0.1)
Basophils Relative: 1 %
EOS ABS: 0.1 10*3/uL (ref 0–0.7)
EOS PCT: 1 %
HCT: 39.7 % (ref 35.0–47.0)
Hemoglobin: 13.3 g/dL (ref 12.0–16.0)
LYMPHS PCT: 26 %
Lymphs Abs: 1.6 10*3/uL (ref 1.0–3.6)
MCH: 30.2 pg (ref 26.0–34.0)
MCHC: 33.6 g/dL (ref 32.0–36.0)
MCV: 89.8 fL (ref 80.0–100.0)
MONO ABS: 0.5 10*3/uL (ref 0.2–0.9)
Monocytes Relative: 8 %
Neutro Abs: 4 10*3/uL (ref 1.4–6.5)
Neutrophils Relative %: 64 %
PLATELETS: 244 10*3/uL (ref 150–440)
RBC: 4.42 MIL/uL (ref 3.80–5.20)
RDW: 13.1 % (ref 11.5–14.5)
WBC: 6.2 10*3/uL (ref 3.6–11.0)

## 2015-05-17 LAB — POCT PREGNANCY, URINE: PREG TEST UR: NEGATIVE

## 2015-05-17 MED ORDER — LORAZEPAM 1 MG PO TABS: 1.0000 mg | ORAL_TABLET | Freq: Three times a day (TID) | ORAL | Status: AC | PRN

## 2015-05-17 MED ORDER — KETOROLAC TROMETHAMINE 30 MG/ML IJ SOLN
30.0000 mg | Freq: Once | INTRAMUSCULAR | Status: AC
  Administered 2015-05-17: 30 mg via INTRAVENOUS
  Filled 2015-05-17: qty 1

## 2015-05-17 MED ORDER — LORAZEPAM 2 MG/ML IJ SOLN
1.0000 mg | Freq: Once | INTRAMUSCULAR | Status: AC
  Administered 2015-05-17: 1 mg via INTRAVENOUS
  Filled 2015-05-17: qty 1

## 2015-05-17 MED ORDER — ONDANSETRON HCL 4 MG PO TABS: 4.0000 mg | ORAL_TABLET | Freq: Three times a day (TID) | ORAL | Status: DC | PRN

## 2015-05-17 MED ORDER — TRAMADOL HCL 50 MG PO TABS: 50.0000 mg | ORAL_TABLET | Freq: Four times a day (QID) | ORAL | Status: DC | PRN

## 2015-05-17 NOTE — ED Provider Notes (Signed)
Time Seen: Approximately ----------------------------------------- 9:55 AM on 05/17/2015 -----------------------------------------   I have reviewed the triage notes  Chief Complaint: Flank Pain   History of Present Illness: Victoria Dorsey is a 35 y.o. female who presents with acute onset of left flank pain she states started yesterday. She states the pain is relatively constant and is Progressively worse since last night. Patient points exclusively to the left flank area and states her pain is somewhat worse with inspiration but otherwise no exacerbating or relieving factors. She denies any dysuria, hematuria, urinary frequency. She denies any fevers or right-sided flank or abdominal pain. Patient denies any chest pain or shortness of breath. Patient denies any melena, hematochezia, loose stool, constipation. Patient denies any vaginal bleeding or discharge and states her last menstrual period was normal and she's had a previous tubal ligation.   Past Medical History  Diagnosis Date  . Asthma   . Anxiety   . Depression     Patient Active Problem List   Diagnosis Date Noted  . Opioid dependence with opioid-induced mood disorder (HCC)   . Polysubstance abuse 12/15/2014    Past Surgical History  Procedure Laterality Date  . Cesarean section      Past Surgical History  Procedure Laterality Date  . Cesarean section      Current Outpatient Rx  Name  Route  Sig  Dispense  Refill  . cephALEXin (KEFLEX) 500 MG capsule   Oral   Take 1 capsule (500 mg total) by mouth every 12 (twelve) hours.         . citalopram (CELEXA) 20 MG tablet   Oral   Take 1 tablet (20 mg total) by mouth daily.   30 tablet   0     Allergies:  Review of patient's allergies indicates no known allergies.  Family History: No family history on file.  Social History: Social History  Substance Use Topics  . Smoking status: Current Every Day Smoker -- 1.00 packs/day for 20 years    Types:  Cigarettes  . Smokeless tobacco: None  . Alcohol Use: 0.6 oz/week    1 Shots of liquor per week     Comment: special occasion, once monthly     Review of Systems:   10 point review of systems was performed and was otherwise negative:  Constitutional: No fever Eyes: No visual disturbances ENT: No sore throat, ear pain Cardiac: No chest pain Respiratory: No shortness of breath, wheezing, or stridor Abdomen: No abdominal pain, no vomiting, No diarrhea Endocrine: No weight loss, No night sweats Extremities: No peripheral edema, cyanosis Skin: No rashes, easy bruising Neurologic: No focal weakness, trouble with speech or swollowing Urologic: No dysuria, Hematuria, or urinary frequency   Physical Exam:  ED Triage Vitals  Enc Vitals Group     BP 05/17/15 0904 135/83 mmHg     Pulse Rate 05/17/15 0904 100     Resp 05/17/15 0904 18     Temp 05/17/15 0904 98.7 F (37.1 C)     Temp Source 05/17/15 0904 Oral     SpO2 05/17/15 0904 99 %     Weight --      Height 05/17/15 0904 5\' 1"  (1.549 m)     Head Cir --      Peak Flow --      Pain Score 05/17/15 0905 8     Pain Loc --      Pain Edu? --      Excl. in GC? --  General: Awake , Alert , and Oriented times 3; GCS 15 Head: Normal cephalic , atraumatic Eyes: Pupils equal , round, reactive to light Nose/Throat: No nasal drainage, patent upper airway without erythema or exudate.  Neck: Supple, Full range of motion, No anterior adenopathy or palpable thyroid masses Lungs: Clear to ascultation without wheezes , rhonchi, or rales Heart: Regular rate, regular rhythm without murmurs , gallops , or rubs Abdomen: Soft, non tender without rebound, guarding , or rigidity; bowel sounds positive and symmetric in all 4 quadrants. No organomegaly .        Extremities: 2 plus symmetric pulses. No edema, clubbing or cyanosis Neurologic: normal ambulation, Motor symmetric without deficits, sensory intact Skin: warm, dry, no rashes Mild  reproducible pain over the left flank area to deep palpation  Labs:   All laboratory work was reviewed including any pertinent negatives or positives listed below:  Labs Reviewed  URINALYSIS COMPLETEWITH MICROSCOPIC ([facility] ONLY)  BASIC METABOLIC PANEL  CBC WITH DIFFERENTIAL/PLATELET  POCT PREGNANCY, URINE   Review of laboratory work showed no significant findings   Radiology:    EXAM: CT ABDOMEN AND PELVIS WITHOUT CONTRAST  TECHNIQUE: Multidetector CT imaging of the abdomen and pelvis was performed following the standard protocol without IV contrast.  COMPARISON: CT abdomen and pelvis 02/17/2014.  FINDINGS: The lung bases are clear. No pleural or pericardial effusion.  There is no hydronephrosis on the right or left and no urinary tract stones are identified. The kidneys, ureters and urinary bladder all appear normal. Uterus and adnexa also appear normal.  The gallbladder, liver, spleen, adrenal glands and pancreas are unremarkable. The stomach, small and large bowel and appendix appear normal. There is no lymphadenopathy or fluid. No focal bony abnormality is identified.  IMPRESSION: Negative for urinary tract stone. Negative CT abdomen and pelvis.    I personally reviewed the radiologic studies     ED Course:  Patient's stay here was uneventful and she had little improvement with standard pain medications including Toradol and also was given some Ativan for what seemed to be some underlying anxiety component. Her differential includes causes for lower abdominal pain in females.Differential diagnosis includes but is not exclusive to ovarian cyst, ovarian torsion, acute appendicitis, urinary tract infection, endometriosis, bowel obstruction, colitis, renal colic, gastroenteritis, etc. Given her objective and CAT scan results at this time I felt this was unlikely to be a life-threatening cause for her pain at this time. Patient has a long history of polysubstance  abuse and seemed to be upset that she would not be receiving any more pain medication including narcotics. She was discharged with Ultram and Zofran as her only adverse effect from the Ultram as nausea. She was advised drink plenty of fluids and return here if she has any new symptoms including fever, bloody stool, bloody urine, or any other new concerns    Assessment: * Acute unspecified left flank pain Drug-seeking behavior    Plan:  Patient was advised to return immediately if condition worsens. Patient was advised to follow up with their primary care physician or other specialized physicians involved in their outpatient care            Jennye MoccasinBrian S Shemeca Lukasik, MD 05/17/15 1553

## 2015-05-17 NOTE — ED Notes (Signed)
Pt states she has not had any relief with pains meds given previous, EDP notified..Marland Kitchen

## 2015-05-17 NOTE — ED Notes (Signed)
Pt presents with left flank pain started this am acute onset. Pt with frequent urination.

## 2015-05-17 NOTE — Discharge Instructions (Signed)
Flank Pain Flank pain refers to pain that is located on the side of the body between the upper abdomen and the back. The pain may occur over a short period of time (acute) or may be long-term or reoccurring (chronic). It may be mild or severe. Flank pain can be caused by many things. CAUSES  Some of the more common causes of flank pain include:  Muscle strains.   Muscle spasms.   A disease of your spine (vertebral disk disease).   A lung infection (pneumonia).   Fluid around your lungs (pulmonary edema).   A kidney infection.   Kidney stones.   A very painful skin rash caused by the chickenpox virus (shingles).   Gallbladder disease.  HOME CARE INSTRUCTIONS  Home care will depend on the cause of your pain. In general,  Rest as directed by your caregiver.  Drink enough fluids to keep your urine clear or pale yellow.  Only take over-the-counter or prescription medicines as directed by your caregiver. Some medicines may help relieve the pain.  Tell your caregiver about any changes in your pain.  Follow up with your caregiver as directed. SEEK IMMEDIATE MEDICAL CARE IF:   Your pain is not controlled with medicine.   You have new or worsening symptoms.  Your pain increases.   You have abdominal pain.   You have shortness of breath.   You have persistent nausea or vomiting.   You have swelling in your abdomen.   You feel faint or pass out.   You have blood in your urine.  You have a fever or persistent symptoms for more than 2-3 days.  You have a fever and your symptoms suddenly get worse. MAKE SURE YOU:   Understand these instructions.  Will watch your condition.  Will get help right away if you are not doing well or get worse.   This information is not intended to replace advice given to you by your health care provider. Make sure you discuss any questions you have with your health care provider.   Document Released: 06/08/2005 Document  Revised: 01/10/2012 Document Reviewed: 11/30/2011 Elsevier Interactive Patient Education [employer]2016 [employer].  Please return immediately if condition worsens. Please contact her primary physician or the physician you were given for referral. If you have any specialist physicians involved in her treatment and plan please also contact them. Thank you for using [facility].

## 2015-09-22 ENCOUNTER — Encounter: Payer: Self-pay | Admitting: Emergency Medicine

## 2015-09-22 ENCOUNTER — Emergency Department
Admission: EM | Admit: 2015-09-22 | Discharge: 2015-09-22 | Disposition: A | Discharge status: Home / Self Care | Payer: Medicaid Other | Attending: Emergency Medicine | Admitting: Emergency Medicine

## 2015-09-22 DIAGNOSIS — F1910 Other psychoactive substance abuse, uncomplicated: Secondary | ICD-10-CM | POA: Insufficient documentation

## 2015-09-22 DIAGNOSIS — F1920 Other psychoactive substance dependence, uncomplicated: Secondary | ICD-10-CM | POA: Diagnosis present

## 2015-09-22 DIAGNOSIS — F329 Major depressive disorder, single episode, unspecified: Secondary | ICD-10-CM | POA: Diagnosis not present

## 2015-09-22 DIAGNOSIS — F1490 Cocaine use, unspecified, uncomplicated: Secondary | ICD-10-CM | POA: Insufficient documentation

## 2015-09-22 DIAGNOSIS — J45909 Unspecified asthma, uncomplicated: Secondary | ICD-10-CM | POA: Insufficient documentation

## 2015-09-22 DIAGNOSIS — F17200 Nicotine dependence, unspecified, uncomplicated: Secondary | ICD-10-CM | POA: Diagnosis not present

## 2015-09-22 MED ORDER — IBUPROFEN 800 MG PO TABS
800.0000 mg | ORAL_TABLET | Freq: Once | ORAL | Status: AC
  Administered 2015-09-22: 800 mg via ORAL
  Filled 2015-09-22: qty 1

## 2015-09-22 MED ORDER — DIAZEPAM 5 MG PO TABS
5.0000 mg | ORAL_TABLET | Freq: Once | ORAL | Status: AC
  Administered 2015-09-22: 5 mg via ORAL
  Filled 2015-09-22: qty 1

## 2015-09-22 NOTE — ED Notes (Signed)
MD spoke with patient and family regarding detox. Patient and family urged to called [facility] for treatment.

## 2015-09-22 NOTE — ED Notes (Addendum)
Patient to ER for "wanting detox". Patient states she has been using heroin, crack cocaine, and pills. Denies any alcohol use. Patient also reports feeling anxious.

## 2015-09-22 NOTE — ED Notes (Signed)
Pt's family and friends asking to speak to EDP at this time. Pt seems agitated that she may be getting outpatient treatment, as opposed to being admitted. Pt stating that she should "go out and get an 12 pack, get drunk, and come back later". Mayford KnifeWilliams, MD made aware.

## 2015-09-22 NOTE — Discharge Instructions (Signed)
Polysubstance Abuse °When people abuse more than one drug or type of drug it is called polysubstance or polydrug abuse. For example, many smokers also drink alcohol. This is one form of polydrug abuse. Polydrug abuse also refers to the use of a drug to counteract an unpleasant effect produced by another drug. It may also be used to help with withdrawal from another drug. People who take stimulants may become agitated. Sometimes this agitation is countered with a tranquilizer. This helps protect against the unpleasant side effects. Polydrug abuse also refers to the use of different drugs at the same time.  °Anytime drug use is interfering with normal living activities, it has become abuse. This includes problems with family and friends. Psychological dependence has developed when your mind tells you that the drug is needed. This is usually followed by physical dependence which has developed when continuing increases of drug are required to get the same feeling or "high". This is known as addiction or chemical dependency. A person's risk is much higher if there is a history of chemical dependency in the family. °SIGNS OF CHEMICAL DEPENDENCY °· You have been told by friends or family that drugs have become a problem. °· You fight when using drugs. °· You are having blackouts (not remembering what you do while using). °· You feel sick from using drugs but continue using. °· You lie about use or amounts of drugs (chemicals) used. °· You need chemicals to get you going. °· You are suffering in work performance or in school because of drug use. °· You get sick from use of drugs but continue to use anyway. °· You need drugs to relate to people or feel comfortable in social situations. °· You use drugs to forget problems. °"Yes" answered to any of the above signs of chemical dependency indicates there are problems. The longer the use of drugs continues, the greater the problems will become. °If there is a family history of  drug or alcohol use, it is best not to experiment with these drugs. Continual use leads to tolerance. After tolerance develops more of the drug is needed to get the same feeling. This is followed by addiction. With addiction, drugs become the most important part of life. It becomes more important to take drugs than participate in the other usual activities of life. This includes relating to friends and family. Addiction is followed by dependency. Dependency is a condition where drugs are now needed not just to get high, but to feel normal. °Addiction cannot be cured but it can be stopped. This often requires outside help and the care of professionals. Treatment centers are listed in the yellow pages under: Cocaine, Narcotics, and Alcoholics Anonymous. Most hospitals and clinics can refer you to a specialized care center. Talk to your caregiver if you need help. °  °This information is not intended to replace advice given to you by your health care provider. Make sure you discuss any questions you have with your health care provider. °  °Document Released: 12/07/2004 Document Revised: 07/10/2011 Document Reviewed: 04/22/2014 °Elsevier Interactive Patient Education ©2016 Elsevier Inc. ° °

## 2015-09-22 NOTE — ED Provider Notes (Signed)
[facility] Emergency Department Provider Note        Time seen: ----------------------------------------- 6:47 PM on 09/22/2015 -----------------------------------------    I have reviewed the triage vital signs and the nursing notes.   HISTORY  Chief Complaint Addiction Problem    HPI Victoria Dorsey is a 35 y.o. female who presents to the ER requesting detox.Patient states she's been using heroin, crack cocaine, and pills. She denies any alcohol use. She does feel anxious currently, states she has gotten around the wrong people and had been using for several days. Patient had heard that we provide detox services here so she was brought here by friends for treatment. She denies any other complaints at this time.   Past Medical History  Diagnosis Date  . Asthma   . Anxiety   . Depression     Patient Active Problem List   Diagnosis Date Noted  . Opioid dependence with opioid-induced mood disorder (HCC)   . Polysubstance abuse 12/15/2014    Past Surgical History  Procedure Laterality Date  . Cesarean section      Allergies Review of patient's allergies indicates no known allergies.  Social History Social History  Substance Use Topics  . Smoking status: Current Every Day Smoker -- 1.00 packs/day for 20 years    Types: Cigarettes  . Smokeless tobacco: None  . Alcohol Use: No    Review of Systems Constitutional: Negative for fever. Eyes: Negative for visual changes. ENT: Negative for sore throat. Cardiovascular: Negative for chest pain. Respiratory: Negative for shortness of breath. Gastrointestinal: Negative for abdominal pain, vomiting and diarrhea. Genitourinary: Negative for dysuria. Musculoskeletal: Negative for back pain. Skin: Negative for rash. Neurological: Negative for headaches, focal weakness or numbness. Psychiatric: Positive for anxiety  10-point ROS otherwise  negative.  ____________________________________________   PHYSICAL EXAM:  VITAL SIGNS: ED Triage Vitals  Enc Vitals Group     BP 09/22/15 1833 143/91 mmHg     Pulse Rate 09/22/15 1833 89     Resp --      Temp 09/22/15 1833 97.6 F (36.4 C)     Temp Source 09/22/15 1833 Oral     SpO2 09/22/15 1833 99 %     Weight 09/22/15 1833 141 lb (63.957 kg)     Height 09/22/15 1833 5\' 1"  (1.549 m)     Head Cir --      Peak Flow --      Pain Score --      Pain Loc --      Pain Edu? --      Excl. in GC? --     Constitutional: Alert and oriented. Well appearing and in no distress. Eyes: Conjunctivae are normal. PERRL. Normal extraocular movements. ENT   Head: Normocephalic and atraumatic.   Nose: No congestion/rhinnorhea.   Mouth/Throat: Mucous membranes are moist.   Neck: No stridor. Cardiovascular: Normal rate, regular rhythm. No murmurs, rubs, or gallops. Respiratory: Normal respiratory effort without tachypnea nor retractions. Breath sounds are clear and equal bilaterally. No wheezes/rales/rhonchi. Gastrointestinal: Soft and nontender. Normal bowel sounds Musculoskeletal: Nontender with normal range of motion in all extremities. No lower extremity tenderness nor edema. Neurologic:  Normal speech and language. No gross focal neurologic deficits are appreciated.  Skin:  Skin is warm, dry and intact. No rash noted. Psychiatric: Mood and affect are normal. Speech and behavior are normal.  ____________________________________________  ED COURSE:  Pertinent labs & imaging results that were available during my care of the patient were  reviewed by me and considered in my medical decision making (see chart for details). Patient is in no acute distress, advised we do not provide detox services for what she is describing. I will refer her to [facility], she was given Motrin and Valium here for her symptoms.   ____________________________________________  FINAL ASSESSMENT AND  PLAN  Polysubstance abuse  Plan: Patient presents with polysubstance abuse, she is stable for outpatient referral to [facility].    Emily Filbert, MD   Note: This dictation was prepared with Dragon dictation. Any transcriptional errors that result from this process are unintentional   Emily Filbert, MD 09/22/15 [phone]

## 2016-09-15 ENCOUNTER — Emergency Department
Admission: EM | Admit: 2016-09-15 | Discharge: 2016-09-15 | Disposition: A | Discharge status: Rehabilitation Facility | Payer: Medicaid Other | Attending: Emergency Medicine | Admitting: Emergency Medicine

## 2016-09-15 ENCOUNTER — Encounter: Payer: Self-pay | Admitting: Emergency Medicine

## 2016-09-15 DIAGNOSIS — I952 Hypotension due to drugs: Secondary | ICD-10-CM | POA: Insufficient documentation

## 2016-09-15 DIAGNOSIS — R001 Bradycardia, unspecified: Secondary | ICD-10-CM | POA: Diagnosis not present

## 2016-09-15 DIAGNOSIS — J45909 Unspecified asthma, uncomplicated: Secondary | ICD-10-CM | POA: Insufficient documentation

## 2016-09-15 DIAGNOSIS — T465X5A Adverse effect of other antihypertensive drugs, initial encounter: Secondary | ICD-10-CM | POA: Diagnosis not present

## 2016-09-15 DIAGNOSIS — F1129 Opioid dependence with unspecified opioid-induced disorder: Secondary | ICD-10-CM | POA: Insufficient documentation

## 2016-09-15 DIAGNOSIS — F558 Abuse of other non-psychoactive substances: Secondary | ICD-10-CM | POA: Diagnosis not present

## 2016-09-15 DIAGNOSIS — F1124 Opioid dependence with opioid-induced mood disorder: Secondary | ICD-10-CM

## 2016-09-15 DIAGNOSIS — F17210 Nicotine dependence, cigarettes, uncomplicated: Secondary | ICD-10-CM | POA: Insufficient documentation

## 2016-09-15 DIAGNOSIS — T50905A Adverse effect of unspecified drugs, medicaments and biological substances, initial encounter: Secondary | ICD-10-CM

## 2016-09-15 DIAGNOSIS — I959 Hypotension, unspecified: Secondary | ICD-10-CM | POA: Diagnosis present

## 2016-09-15 DIAGNOSIS — Z79899 Other long term (current) drug therapy: Secondary | ICD-10-CM | POA: Diagnosis not present

## 2016-09-15 HISTORY — DX: Opioid dependence with opioid-induced mood disorder: F11.24

## 2016-09-15 HISTORY — DX: Other psychoactive substance abuse, uncomplicated: F19.10

## 2016-09-15 LAB — BASIC METABOLIC PANEL
ANION GAP: 4 — AB (ref 5–15)
BUN: 12 mg/dL (ref 6–20)
CALCIUM: 8.4 mg/dL — AB (ref 8.9–10.3)
CHLORIDE: 106 mmol/L (ref 101–111)
CO2: 27 mmol/L (ref 22–32)
CREATININE: 0.73 mg/dL (ref 0.44–1.00)
Glucose, Bld: 104 mg/dL — ABNORMAL HIGH (ref 65–99)
POTASSIUM: 3.4 mmol/L — AB (ref 3.5–5.1)
SODIUM: 137 mmol/L (ref 135–145)

## 2016-09-15 LAB — CBC
HCT: 39 % (ref 35.0–47.0)
HEMOGLOBIN: 13.5 g/dL (ref 12.0–16.0)
MCH: 31.2 pg (ref 26.0–34.0)
MCHC: 34.6 g/dL (ref 32.0–36.0)
MCV: 90.2 fL (ref 80.0–100.0)
PLATELETS: 164 10*3/uL (ref 150–440)
RBC: 4.33 MIL/uL (ref 3.80–5.20)
RDW: 12.4 % (ref 11.5–14.5)
WBC: 5.6 10*3/uL (ref 3.6–11.0)

## 2016-09-15 LAB — TROPONIN I

## 2016-09-15 LAB — POCT PREGNANCY, URINE: PREG TEST UR: NEGATIVE

## 2016-09-15 MED ORDER — SODIUM CHLORIDE 0.9 % IV BOLUS (SEPSIS)
1000.0000 mL | Freq: Once | INTRAVENOUS | Status: AC
  Administered 2016-09-15: 1000 mL via INTRAVENOUS

## 2016-09-15 NOTE — ED Provider Notes (Signed)
Patient currently in detox. Started clonidine recently. Was found to have bradycardia in the 40s and a low blood pressure. Plan is to continue to observe and reassess her hemodynamics.  Physical Exam  BP 103/62 (BP Location: Left Arm)   Pulse (!) 36   Temp 97.6 F (36.4 C) (Oral)   Resp 12   Ht 5\' 1"  (1.549 m)   Wt 120 lb (54.4 kg)   LMP 09/07/2016   SpO2 100%   BMI 22.67 kg/m  ----------------------------------------- 11:14 AM on 09/15/2016 -----------------------------------------   Physical Exam Pulse ox minute is 36, last operated vital signs. However, when I was to room 10 minutes ago the pulse was 75. Pulse on the monitor now is 76 at 11:14 AM. Blood pressure of 96/69. ED Course  Procedures  MDM Patient to be dispositioned back to rehabilitation. Recommend that she not continue with her clonidine but she is not symptomatic at this time from her bradycardia which appears to respond to activity appropriately. Hypotension also not critical.       Myrna BlazerSchaevitz, Victoria Dorsey Matthew, MD 09/15/16 1115

## 2016-09-15 NOTE — ED Notes (Addendum)
Pt states she has been getting clonidine for detox and had her last one at 10pm last night started detox yesterday.

## 2016-09-15 NOTE — ED Provider Notes (Signed)
[facility] Emergency Department Provider Note  ____________________________________________   First MD Initiated Contact with Patient 09/15/16 [phone]     (approximate)  I have reviewed the triage vital signs and the nursing notes.   HISTORY  Chief Complaint Hypotension and Weakness  The patient is somnolent but still alert enough to give a medical history  HPI Victoria Dorsey is a 36 y.o. female with a history of anxiety, depression, and chronic Suboxone use and narcotics dependence who presents for evaluation of hypotension and bradycardia.  She is currently at [facility] for detox from [facility]one use.  She was started on clonidine last night and shortly thereafter developed somnolence, dizziness, bradycardia, and hypotension reportedly with a systolic blood pressure in the 70s.  Upon arrival in the emergency department her blood pressure had improved to about 100 systolic although it fluctuates in drops back down into the 80s while she is at rest.  Her heart rate ranges from the upper 30s to the 60s but is most consistently in the 40s.  She is somnolent but awakens to light touch and voice and is able to answer questions.  She denies any other drug use and states she has not been on clonidine before.  She denies any other symptoms as described in the review of systems below.  She states only that she feels dizzy, particularly when she stands up.  She describes symptoms as moderateand nothing makes them better nor worse   Past Medical History:  Diagnosis Date  . Anxiety   . Asthma   . Depression   . Opioid dependence with opioid-induced mood disorder (HCC) 09/15/2016   Detox from Suboxone  . Polysubstance abuse     Patient Active Problem List   Diagnosis Date Noted  . Opioid dependence with opioid-induced mood disorder (HCC)   . Polysubstance abuse 12/15/2014    Past Surgical History:  Procedure Laterality Date  . CESAREAN SECTION      Prior to  Admission medications   Medication Sig Start Date End Date Taking? Authorizing Provider  cephALEXin (KEFLEX) 500 MG capsule Take 1 capsule (500 mg total) by mouth every 12 (twelve) hours. Patient not taking: Reported on 05/17/2015 12/18/14   Adonis Brook, NP  citalopram (CELEXA) 20 MG tablet Take 1 tablet (20 mg total) by mouth daily. Patient not taking: Reported on 05/17/2015 12/18/14   Adonis Brook, NP  ondansetron (ZOFRAN) 4 MG tablet Take 1 tablet (4 mg total) by mouth every 8 (eight) hours as needed for nausea or vomiting. 05/17/15   Jennye Moccasin, MD  traMADol (ULTRAM) 50 MG tablet Take 1 tablet (50 mg total) by mouth every 6 (six) hours as needed. 05/17/15   Jennye Moccasin, MD    Allergies Patient has no known allergies.  History reviewed. No pertinent family history.  Social History Social History  Substance Use Topics  . Smoking status: Current Every Day Smoker    Packs/day: 1.00    Years: 20.00    Types: Cigarettes  . Smokeless tobacco: Never Used  . Alcohol use No    Review of Systems Constitutional: No fever/chills Eyes: No visual changes. ENT: No sore throat. Cardiovascular: Denies chest pain. Respiratory: Denies shortness of breath. Gastrointestinal: No abdominal pain.  No nausea, no vomiting.  No diarrhea.  No constipation. Genitourinary: Negative for dysuria. Musculoskeletal: Negative for neck pain.  Negative for back pain. Integumentary: Negative for rash. Neurological: Negative for headaches, focal weakness or numbness.  +dizziness   ____________________________________________  PHYSICAL EXAM:  VITAL SIGNS: ED Triage Vitals  Enc Vitals Group     BP 09/15/16 0527 103/60     Pulse Rate 09/15/16 0527 (!) 42     Resp 09/15/16 0527 18     Temp 09/15/16 0527 97.6 F (36.4 C)     Temp Source 09/15/16 0527 Oral     SpO2 09/15/16 0527 100 %     Weight 09/15/16 0528 120 lb (54.4 kg)     Height 09/15/16 0528 5\' 1"  (1.549 m)     Head Circumference  --      Peak Flow --      Pain Score 09/15/16 0706 Asleep     Pain Loc --      Pain Edu? --      Excl. in GC? --     Constitutional: Somnolent but alert enough to answer questions.  No acute distress Eyes: Conjunctivae are normal.  Head: Atraumatic. Nose: No congestion/rhinnorhea. Mouth/Throat: Mucous membranes are moist. Neck: No stridor.  No meningeal signs.   Cardiovascular: Bradycardia, regular rhythm. Good peripheral circulation. Grossly normal heart sounds. Respiratory: Normal respiratory effort.  No retractions. Lungs CTAB. Gastrointestinal: Soft and nontender. No distention.  Musculoskeletal: No lower extremity tenderness nor edema. No gross deformities of extremities. Neurologic:  Normal speech and language. No gross focal neurologic deficits are appreciated.  Skin:  Skin is warm, dry and intact. No rash noted. Psychiatric: Mood and affect are somnolent and flat but otherwise unremarkable  ____________________________________________   LABS (all labs ordered are listed, but only abnormal results are displayed)  Labs Reviewed  BASIC METABOLIC PANEL - Abnormal; Notable for the following:       Result Value   Potassium 3.4 (*)    Glucose, Bld 104 (*)    Calcium 8.4 (*)    Anion gap 4 (*)    All other components within normal limits  CBC  TROPONIN I  URINALYSIS, COMPLETE (UACMP) WITH MICROSCOPIC  URINALYSIS, ROUTINE W REFLEX MICROSCOPIC  URINE DRUG SCREEN, QUALITATIVE ([facility] ONLY)  POC URINE PREG, ED   ____________________________________________  EKG  ED ECG REPORT I, Thyra Yinger, the attending physician, personally viewed and interpreted this ECG.  Date: 09/15/2016 EKG Time: 5:36am Rate: 43 Rhythm: sinus bradycardia QRS Axis: normal Intervals: normal ST/T Wave abnormalities: normal Conduction Disturbances: none Narrative Interpretation: unremarkable except for bradycardia  ____________________________________________  RADIOLOGY   No results  found.  ____________________________________________   PROCEDURES  Critical Care performed: No   Procedure(s) performed:   Procedures   ____________________________________________   INITIAL IMPRESSION / ASSESSMENT AND PLAN / ED COURSE  Pertinent labs & imaging results that were available during my care of the patient were reviewed by me and considered in my medical decision making (see chart for details).  The patient is bradycardic but she is essentially asymptomatic while lying down.  She is borderline hypotensive and I gave 1 L of fluids but I do not want to overload her with too much IV fluid if it is not necessary.  She appears to be having a dramatic response to the clonidine and the amount of clonidine administered is unknown.  We will observe her for a period of 6 hours to see if her vital signs improve and if her somnolence improves.  I have also asked nursing to check a urinalysis as well as a urine drug screen.  I have transferred care to Dr. Pershing Proud to reassess and disposition appropriately.      ____________________________________________  FINAL CLINICAL  IMPRESSION(S) / ED DIAGNOSES  Final diagnoses:  Bradycardia, drug induced  Hypotension due to drugs  Opioid dependence with opioid-induced disorder (HCC)     MEDICATIONS GIVEN DURING THIS VISIT:  Medications  sodium chloride 0.9 % bolus 1,000 mL (1,000 mLs Intravenous New Bag/Given 09/15/16 0546)     NEW OUTPATIENT MEDICATIONS STARTED DURING THIS VISIT:  New Prescriptions   No medications on file    Modified Medications   No medications on file    Discontinued Medications   No medications on file     Note:  This document was prepared using Dragon voice recognition software and may include unintentional dictation errors.    Loleta RoseForbach, Makala Fetterolf, MD 09/15/16 [phone]

## 2016-09-15 NOTE — ED Notes (Signed)
Pt brought over from [facility] with report of low BP and HR, detoxing from opiates

## 2016-09-15 NOTE — ED Triage Notes (Signed)
Pt to triage via wc from [facility], states low BP with SBP in 70s and low HR in 40s.  PT HR 42 BP 103/60.  Pt report weakness.  Pt at [facility] for [facility]iate detox.

## 2016-09-15 NOTE — BH Assessment (Signed)
TTS received phone call from [facility]([place]) stating that pt is a current resident. Per [facility] if d/c pt may return to [facility]. [facility] will need to pick pt up from hospital and not pt's family. Hospital doctorAmber, RN informed.  [facility] contact #: [phone]

## 2016-09-15 NOTE — ED Notes (Signed)
No patches present on patient

## 2016-09-21 ENCOUNTER — Emergency Department: Payer: Medicaid Other

## 2016-09-21 ENCOUNTER — Emergency Department
Admission: EM | Admit: 2016-09-21 | Discharge: 2016-09-21 | Disposition: A | Discharge status: Home / Self Care | Payer: Medicaid Other | Attending: Emergency Medicine | Admitting: Emergency Medicine

## 2016-09-21 DIAGNOSIS — J069 Acute upper respiratory infection, unspecified: Secondary | ICD-10-CM | POA: Diagnosis not present

## 2016-09-21 DIAGNOSIS — Z79899 Other long term (current) drug therapy: Secondary | ICD-10-CM | POA: Insufficient documentation

## 2016-09-21 DIAGNOSIS — F17210 Nicotine dependence, cigarettes, uncomplicated: Secondary | ICD-10-CM | POA: Insufficient documentation

## 2016-09-21 DIAGNOSIS — F1490 Cocaine use, unspecified, uncomplicated: Secondary | ICD-10-CM | POA: Diagnosis not present

## 2016-09-21 DIAGNOSIS — J45909 Unspecified asthma, uncomplicated: Secondary | ICD-10-CM | POA: Diagnosis not present

## 2016-09-21 DIAGNOSIS — R05 Cough: Secondary | ICD-10-CM | POA: Diagnosis present

## 2016-09-21 DIAGNOSIS — B9789 Other viral agents as the cause of diseases classified elsewhere: Secondary | ICD-10-CM

## 2016-09-21 MED ORDER — BENZONATATE 100 MG PO CAPS: 100.0000 mg | ORAL_CAPSULE | Freq: Three times a day (TID) | ORAL | 0 refills | Status: AC | PRN

## 2016-09-21 MED ORDER — PREDNISONE 10 MG PO TABS
30.0000 mg | ORAL_TABLET | Freq: Every day | ORAL | 0 refills | Status: DC
Start: 2016-09-21 — End: 2016-09-21

## 2016-09-21 NOTE — ED Provider Notes (Signed)
[facility] Emergency Department Provider Note  ____________________________________________  Time seen: Approximately 10:21 PM  I have reviewed the triage vital signs and the nursing notes.   HISTORY  Chief Complaint Cough    HPI Victoria Dorsey is a 36 y.o. female that presents to the emergency department with 3 days of cough and congestion. Patient states that cough worsened today. She states that it is extremely painful on her right side with coughing. It is keeping her up at night. She has a history of asthma and has an albuterol inhaler. She used it today without relief. She got a DuoNeb on the ambulance, which helped. She states that she is primarily here for something for pain because her ribs are really hurting. She smokes 3 cigarettes per day. She denies substance abuse. She denies fever, shortness of breath, chest pain, nausea, vomiting, abdominal pain.   Past Medical History:  Diagnosis Date  . Anxiety   . Asthma   . Depression   . Opioid dependence with opioid-induced mood disorder (HCC) 09/15/2016   Detox from Suboxone  . Polysubstance abuse     Patient Active Problem List   Diagnosis Date Noted  . Opioid dependence with opioid-induced mood disorder (HCC)   . Polysubstance abuse 12/15/2014    Past Surgical History:  Procedure Laterality Date  . CESAREAN SECTION      Prior to Admission medications   Medication Sig Start Date End Date Taking? Authorizing Provider  benzonatate (TESSALON PERLES) 100 MG capsule Take 1 capsule (100 mg total) by mouth 3 (three) times daily as needed for cough. 09/21/16 09/21/17  Victoria Dorsey, Victoria Gavin, PA-C  cephALEXin (KEFLEX) 500 MG capsule Take 1 capsule (500 mg total) by mouth every 12 (twelve) hours. Patient not taking: Reported on 05/17/2015 12/18/14   Adonis Dorsey, Sheila, NP  citalopram (CELEXA) 20 MG tablet Take 1 tablet (20 mg total) by mouth daily. Patient not taking: Reported on 05/17/2015 12/18/14   Adonis Dorsey,  Sheila, NP  ondansetron (ZOFRAN) 4 MG tablet Take 1 tablet (4 mg total) by mouth every 8 (eight) hours as needed for nausea or vomiting. 05/17/15   Victoria Dorsey, Victoria S, MD  traMADol (ULTRAM) 50 MG tablet Take 1 tablet (50 mg total) by mouth every 6 (six) hours as needed. 05/17/15   Victoria Dorsey, Victoria S, MD    Allergies Patient has no known allergies.  No family history on file.  Social History Social History  Substance Use Topics  . Smoking status: Current Every Day Smoker    Packs/day: 1.00    Years: 20.00    Types: Cigarettes  . Smokeless tobacco: Never Used  . Alcohol use No     Review of Systems  Constitutional: No fever/chills Eyes: No visual changes. No discharge. ENT: Positive for congestion and rhinorrhea. Cardiovascular: No chest pain. Respiratory: Positive for cough. No SOB. Gastrointestinal: No abdominal pain.  No nausea, no vomiting.  No diarrhea.  No constipation. Musculoskeletal: Negative for musculoskeletal pain. Skin: Negative for rash, abrasions, lacerations, ecchymosis. Neurological: Negative for headaches.   ____________________________________________   PHYSICAL EXAM:  VITAL SIGNS: ED Triage Vitals  Enc Vitals Group     BP 09/21/16 2012 114/88     Pulse Rate 09/21/16 2012 95     Resp 09/21/16 2012 20     Temp 09/21/16 2012 98.8 F (37.1 C)     Temp Source 09/21/16 2012 Oral     SpO2 09/21/16 2012 99 %     Weight 09/21/16 2012 125 lb (56.7  kg)     Height 09/21/16 2012 5\' 1"  (1.549 m)     Head Circumference --      Peak Flow --      Pain Score 09/21/16 2021 9     Pain Loc --      Pain Edu? --      Excl. in GC? --      Constitutional: Well appearing and in no acute distress. Eyes: Conjunctivae are normal. PERRL. EOMI. No discharge. Head: Atraumatic. ENT: No frontal and maxillary sinus tenderness.      Ears: Tympanic membranes pearly gray with good landmarks. No discharge.      Nose: Mild congestion/rhinnorhea.      Mouth/Throat: Mucous  membranes are moist. Oropharynx non-erythematous. Tonsils not enlarged. No exudates. Uvula midline. Neck: No stridor.   Hematological/Lymphatic/Immunilogical: No cervical lymphadenopathy. Cardiovascular: Normal rate, regular rhythm.  Good peripheral circulation. Respiratory: Normal respiratory effort without tachypnea or retractions. Lungs CTAB. Good air entry to the bases with no decreased or absent breath sounds. Gastrointestinal: Bowel sounds 4 quadrants. Soft and nontender to palpation. No guarding or rigidity. No palpable masses. No distention. Musculoskeletal: Full range of motion to all extremities. No gross deformities appreciated. Neurologic:  Normal speech and language. No gross focal neurologic deficits are appreciated.  Skin:  Skin is warm, dry and intact. No rash noted.  ____________________________________________   LABS (all labs ordered are listed, but only abnormal results are displayed)  Labs Reviewed - No data to display ____________________________________________  EKG   ____________________________________________  RADIOLOGY [name], personally viewed and evaluated these images (plain radiographs) as part of my medical decision making, as well as reviewing the written report by the radiologist.  Dg Chest 2 View  Result Date: 09/21/2016 CLINICAL DATA:  36 year old female with cough for 3 days productive of green sputum. Congestion. Rib pain. Smoker. EXAM: CHEST  2 VIEW COMPARISON:  CT Abdomen and Pelvis 05/17/2015 FINDINGS: Lung volumes at the upper limits of normal to mildly hyperinflated. The lungs are clear. No pneumothorax or pleural effusion. Normal cardiac size and mediastinal contours. Visualized tracheal air column is within normal limits. Mild osteopenia. No acute osseous abnormality identified. Negative visible bowel gas pattern. IMPRESSION: No acute cardiopulmonary abnormality. Borderline to mild hyperinflation and osteopenia. Electronically Signed    By: Victoria Dorsey M.D.   On: 09/21/2016 20:40    ____________________________________________    PROCEDURES  Procedure(s) performed:    Procedures    Medications - No data to display   ____________________________________________   INITIAL IMPRESSION / ASSESSMENT AND PLAN / ED COURSE  Pertinent labs & imaging results that were available during my care of the patient were reviewed by me and considered in my medical decision making (see chart for details).  Review of the [place] CSRS was performed in accordance of the [facility] prior to dispensing any controlled drugs.   Patient's diagnosis is consistent with upper respiratory infection. Vital signs and exam are reassuring. Patient is afebrile. This is likely viral so no indication for antibiotics at this time. Chest x-ray negative for acute cardiopulmonary processes. I expect that patient is under the influence of illicit drugs or alcohol, but she denies these. She in the hospital earlier tonight with her daughter, whom I also took care of, and her temperament and demeanor was significantly different at that point. Patient is asking for pain medication. Patient has a history of substance abuse and I explained to patient that narcotics are not indicated. Patient will be discharged home with  prescriptions for tessalon pearls. Patient is to follow up with PCP as needed or otherwise directed. Patient is given ED precautions to return to the ED for any worsening or new symptoms.     ____________________________________________  FINAL CLINICAL IMPRESSION(S) / ED DIAGNOSES  Final diagnoses:  Viral URI with cough      NEW MEDICATIONS STARTED DURING THIS VISIT:  New Prescriptions   BENZONATATE (TESSALON PERLES) 100 MG CAPSULE    Take 1 capsule (100 mg total) by mouth 3 (three) times daily as needed for cough.        This chart was dictated using voice recognition software/Dragon. Despite best efforts to proofread, errors can occur which  can change the meaning. Any change was purely unintentional.    Victoria Derry, PA-C 09/22/16 0002    Rockne Menghini, MD 09/27/16 2043

## 2016-09-21 NOTE — ED Notes (Addendum)
Pt left prior to discharge instructions. Attempted to call patient to notify patient her discharge instructions would be left at the front desk but there was no answer. [phone] was called. Discharge instructions left at the front desk in sealed envelope.

## 2016-09-21 NOTE — ED Triage Notes (Signed)
Pt in with co cough for 3 days with congestion. States greenish sputum, co bilat rib soreness upon coughing. Brought in by EMS given 1 duoneb, no shob noted, pt coughing in triage

## 2017-02-07 ENCOUNTER — Emergency Department
Admission: EM | Admit: 2017-02-07 | Discharge: 2017-02-07 | Disposition: A | Discharge status: Home / Self Care | Payer: Medicaid Other | Attending: Emergency Medicine | Admitting: Emergency Medicine

## 2017-02-07 DIAGNOSIS — F17210 Nicotine dependence, cigarettes, uncomplicated: Secondary | ICD-10-CM | POA: Insufficient documentation

## 2017-02-07 DIAGNOSIS — J45909 Unspecified asthma, uncomplicated: Secondary | ICD-10-CM | POA: Diagnosis not present

## 2017-02-07 DIAGNOSIS — F1910 Other psychoactive substance abuse, uncomplicated: Secondary | ICD-10-CM | POA: Insufficient documentation

## 2017-02-07 LAB — CBC
HEMATOCRIT: 38.1 % (ref 35.0–47.0)
Hemoglobin: 13.2 g/dL (ref 12.0–16.0)
MCH: 31.7 pg (ref 26.0–34.0)
MCHC: 34.6 g/dL (ref 32.0–36.0)
MCV: 91.5 fL (ref 80.0–100.0)
PLATELETS: 155 10*3/uL (ref 150–440)
RBC: 4.17 MIL/uL (ref 3.80–5.20)
RDW: 12.5 % (ref 11.5–14.5)
WBC: 7.1 10*3/uL (ref 3.6–11.0)

## 2017-02-07 LAB — COMPREHENSIVE METABOLIC PANEL
ALT: 14 U/L (ref 14–54)
AST: 17 U/L (ref 15–41)
Albumin: 4.2 g/dL (ref 3.5–5.0)
Alkaline Phosphatase: 60 U/L (ref 38–126)
Anion gap: 9 (ref 5–15)
BUN: 10 mg/dL (ref 6–20)
CO2: 25 mmol/L (ref 22–32)
Calcium: 8.6 mg/dL — ABNORMAL LOW (ref 8.9–10.3)
Chloride: 106 mmol/L (ref 101–111)
Creatinine, Ser: 0.58 mg/dL (ref 0.44–1.00)
GFR calc Af Amer: >60 mL/min (ref >60)
GFR calc non Af Amer: >60 mL/min (ref >60)
Glucose, Bld: 89 mg/dL (ref 65–99)
Potassium: 2.8 mmol/L — ABNORMAL LOW (ref 3.5–5.1)
Sodium: 140 mmol/L (ref 135–145)
Total Bilirubin: 0.3 mg/dL (ref 0.3–1.2)
Total Protein: 6.9 g/dL (ref 6.5–8.1)

## 2017-02-07 LAB — URINE DRUG SCREEN, QUALITATIVE (ARMC ONLY)
Amphetamines, Ur Screen: POSITIVE — AB
Barbiturates, Ur Screen: NOT DETECTED
Benzodiazepine, Ur Scrn: POSITIVE — AB
Cannabinoid 50 Ng, Ur ~~LOC~~: NOT DETECTED
Cocaine Metabolite,Ur ~~LOC~~: NOT DETECTED
MDMA (Ecstasy)Ur Screen: NOT DETECTED
Methadone Scn, Ur: NOT DETECTED
Opiate, Ur Screen: NOT DETECTED
Phencyclidine (PCP) Ur S: NOT DETECTED
Tricyclic, Ur Screen: NOT DETECTED

## 2017-02-07 LAB — ETHANOL: Alcohol, Ethyl (B): <10 mg/dL (ref <10)

## 2017-02-07 LAB — POCT PREGNANCY, URINE: Preg Test, Ur: NEGATIVE

## 2017-02-07 NOTE — Discharge Instructions (Signed)
please return if you need any further help please follow-up with detox in [place] as you planned. Follow-up with your family doctor 2.

## 2017-02-07 NOTE — ED Triage Notes (Addendum)
Pt is here today for Psch Eval per her family. Pt states she took a "molly" 2 days ago and a xanax last night. Pt's family was concerned she was going hurt herself. Pt denies any thought or ideations of hurting self.   1148 Pt is requesting a detox for (xanax and molly) program, and is requesting to go to [place]. Pt does not want any information discussed around her family.

## 2017-02-07 NOTE — ED Notes (Signed)

## 2017-02-07 NOTE — ED Provider Notes (Signed)
[facility] Emergency Department Provider Note   ____________________________________________   First MD Initiated Contact with Patient 02/07/17 1214     (approximate)  I have reviewed the triage vital signs and the nursing notes.   HISTORY  Chief Complaint Psychiatric Evaluation   HPI Victoria Dorsey is a 36 y.o. female Reportedly committed by family because they're worried that she's got herself. Patient denies this says she is only done some mildly and some Xanax she denies any medical problems   Past Medical History:  Diagnosis Date  . Anxiety   . Asthma   . Depression   . Opioid dependence with opioid-induced mood disorder (HCC) 09/15/2016   Detox from Suboxone  . Polysubstance abuse     Patient Active Problem List   Diagnosis Date Noted  . Opioid dependence with opioid-induced mood disorder (HCC)   . Polysubstance abuse (HCC) 12/15/2014    Past Surgical History:  Procedure Laterality Date  . CESAREAN SECTION      Prior to Admission medications   Medication Sig Start Date End Date Taking? Authorizing Provider  benzonatate (TESSALON PERLES) 100 MG capsule Take 1 capsule (100 mg total) by mouth 3 (three) times daily as needed for cough. 09/21/16 09/21/17  Enid Derry, PA-C  cephALEXin (KEFLEX) 500 MG capsule Take 1 capsule (500 mg total) by mouth every 12 (twelve) hours. Patient not taking: Reported on 05/17/2015 12/18/14   Adonis Brook, NP  citalopram (CELEXA) 20 MG tablet Take 1 tablet (20 mg total) by mouth daily. Patient not taking: Reported on 05/17/2015 12/18/14   Adonis Brook, NP  ondansetron (ZOFRAN) 4 MG tablet Take 1 tablet (4 mg total) by mouth every 8 (eight) hours as needed for nausea or vomiting. 05/17/15   Jennye Moccasin, MD  traMADol (ULTRAM) 50 MG tablet Take 1 tablet (50 mg total) by mouth every 6 (six) hours as needed. 05/17/15   Jennye Moccasin, MD    Allergies Patient has no known allergies.  No family  history on file.  Social History Social History  Substance Use Topics  . Smoking status: Current Every Day Smoker    Packs/day: 1.00    Years: 20.00    Types: Cigarettes  . Smokeless tobacco: Never Used  . Alcohol use No    Review of Systems  Constitutional: No fever/chills Eyes: No visual changes. ENT: No sore throat. Cardiovascular: Denies chest pain. Respiratory: Denies shortness of breath. Gastrointestinal: No abdominal pain.  No nausea, no vomiting.  No diarrhea.  No constipation. Genitourinary: Negative for dysuria. Musculoskeletal: Negative for back pain. Skin: Negative for rash. Neurological: Negative for headaches, focal weakness   ____________________________________________   PHYSICAL EXAM:  VITAL SIGNS: ED Triage Vitals [02/07/17 1139]  Enc Vitals Group     BP (!) 129/100     Pulse      Resp      Temp 97.7 F (36.5 C)     Temp Source Oral     SpO2 100 %     Weight 115 lb (52.2 kg)     Height  (1.549 m)     Head Circumference      Peak Flow      Pain Score      Pain Loc      Pain Edu?      Excl. in GC?     Constitutional: Alert and oriented. Well appearing and in no acute distress. Eyes: Conjunctivae are normal. Head: Atraumatic. Nose: No congestion/rhinnorhea. Mouth/Throat: Mucous  membranes are moist.  Oropharynx non-erythematous. Neck: No stridor.   Cardiovascular: Normal rate, regular rhythm. Grossly normal heart sounds.  Good peripheral circulation. Respiratory: Normal respiratory effort.  No retractions. Lungs CTAB. Gastrointestinal: Soft and nontender. No distention. No abdominal bruits. No CVA tenderness. Musculoskeletal: No lower extremity tenderness nor edema.  No joint effusions. Neurologic:  Normal speech and language. No gross focal neurologic deficits are appreciated. Skin:  Skin is warm, dry and intact. No rash noted.  ____________________________________________   LABS (all labs ordered are listed, but only abnormal  results are displayed)  Labs Reviewed  COMPREHENSIVE METABOLIC PANEL - Abnormal; Notable for the following:       Result Value   Potassium 2.8 (*)    Calcium 8.6 (*)    All other components within normal limits  URINE DRUG SCREEN, QUALITATIVE ([facility] ONLY) - Abnormal; Notable for the following:    Amphetamines, Ur Screen POSITIVE (*)    Benzodiazepine, Ur Scrn POSITIVE (*)    All other components within normal limits  ETHANOL  CBC  POCT PREGNANCY, URINE  POC URINE PREG, ED   ____________________________________________  EKG   ____________________________________________  RADIOLOGY   ____________________________________________   PROCEDURES  Procedure(s) performed:   Procedures  Critical Care performed:  ____________________________________________   INITIAL IMPRESSION / ASSESSMENT AND PLAN / ED COURSE  patiently reported repeatedly denies homicidal or suicidal ideation waits patiently for Marcelle Smiling wants to leave. Wants to go to [facility] to get help. She is not homicidal or suicidal I cannot keep her against her will. She appears competent and able to understand the consequences of leaving she can repeat back to me. These would be potential of getting worse and not getting a treatment that she came here for.        ____________________________________________   FINAL CLINICAL IMPRESSION(S) / ED DIAGNOSES  Final diagnoses:  Polysubstance abuse (HCC)      NEW MEDICATIONS STARTED DURING THIS VISIT:  New Prescriptions   No medications on file     Note:  This document was prepared using Dragon voice recognition software and may include unintentional dictation errors.    Arnaldo Natal, MD 02/07/17 [phone]

## 2017-02-07 NOTE — ED Notes (Signed)
Pt given lunch tray.

## 2017-02-18 IMAGING — CT CT RENAL STONE PROTOCOL
1 of 2 series · 3 of 32 positions shown, 7 images · non-contrast
Comparison: CT abdomen and pelvis 02/17/2014.

CLINICAL DATA: Left flank pain beginning yesterday with acute
worsening at 1 a.m. last night. No known injury. Initial encounter.

EXAM:
CT ABDOMEN AND PELVIS WITHOUT CONTRAST
TECHNIQUE: Multidetector CT imaging of the abdomen and pelvis was performed
following the standard protocol without IV contrast.

[Series 4: lung windows · axial · 0.71mm/px · z∈[-165,-125]mm · 3 of 17 slices shown, 7 images]
[im 5/17  soft-tissue]
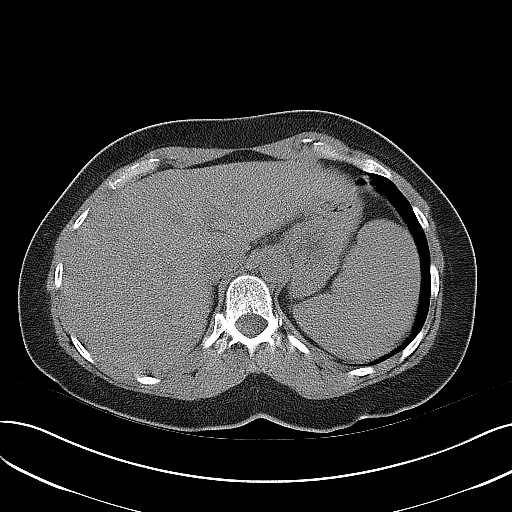
[im 5/17  lung]
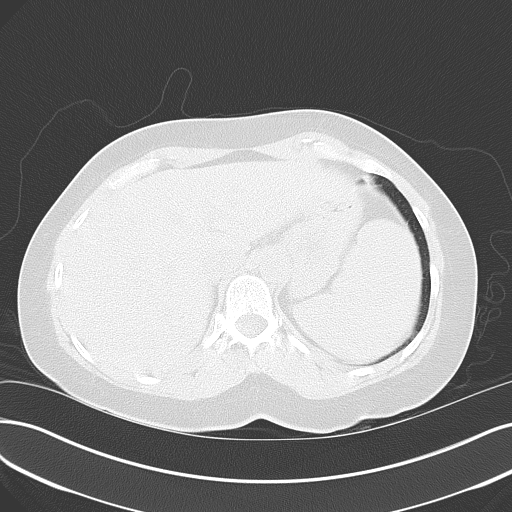
[im 5/17  bone]
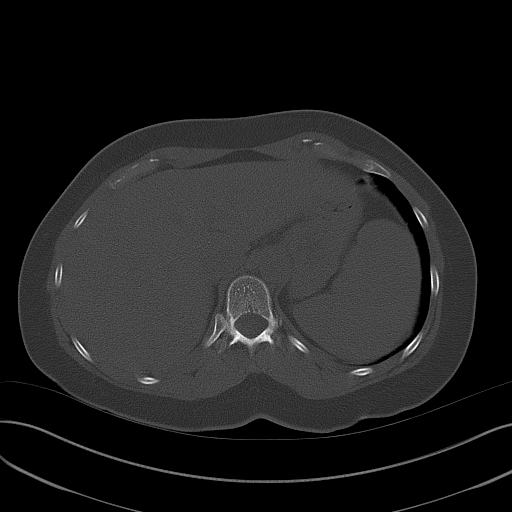
[im 9/17  soft-tissue]
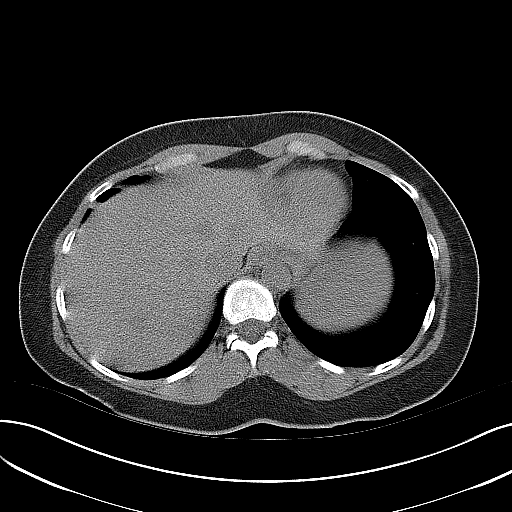
[im 9/17  lung]
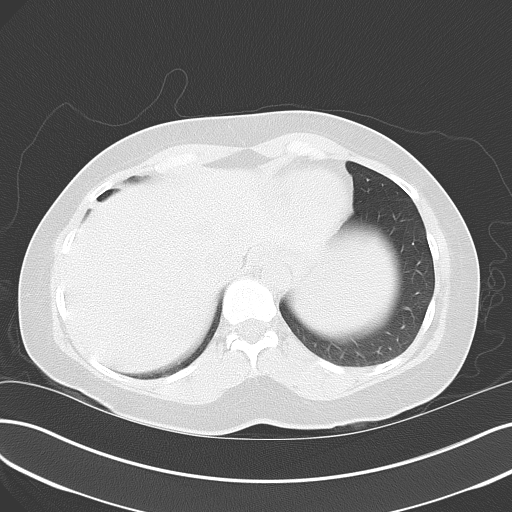
[im 13/17  soft-tissue]
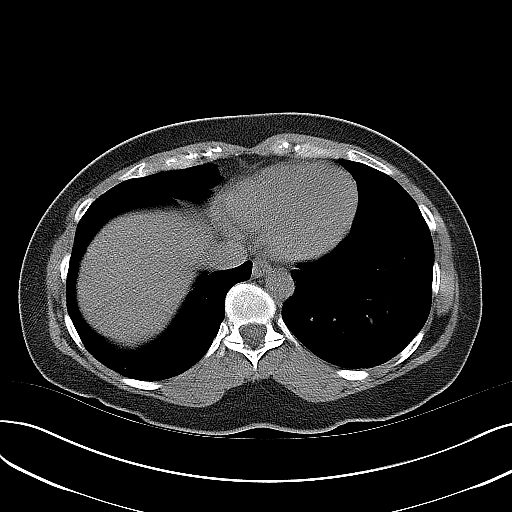
[im 13/17  lung]
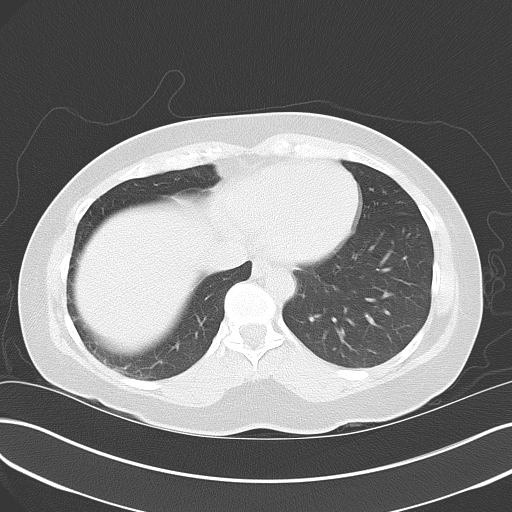

[3 of 32 positions shown; findings below may reference images not displayed]

FINDINGS: The lung bases are clear.  No pleural or pericardial effusion.

There is no hydronephrosis on the right or left and no urinary tract
stones are identified. The kidneys, ureters and urinary bladder all
appear normal. Uterus and adnexa also appear normal.

The gallbladder, liver, spleen, adrenal glands and pancreas are
unremarkable. The stomach, small and large bowel and appendix appear
normal. There is no lymphadenopathy or fluid. No focal bony
abnormality is identified.
IMPRESSION: Negative for urinary tract stone.  Negative CT abdomen and pelvis.

## 2019-07-24 ENCOUNTER — Ambulatory Visit: Payer: Self-pay

## 2019-11-26 ENCOUNTER — Emergency Department
Admission: EM | Admit: 2019-11-26 | Discharge: 2019-11-26 | Disposition: A | Discharge status: Home / Self Care | Payer: Medicaid Other | Attending: Emergency Medicine | Admitting: Emergency Medicine

## 2019-11-26 ENCOUNTER — Other Ambulatory Visit: Payer: Self-pay

## 2019-11-26 ENCOUNTER — Encounter: Payer: Self-pay | Admitting: Emergency Medicine

## 2019-11-26 DIAGNOSIS — F1910 Other psychoactive substance abuse, uncomplicated: Secondary | ICD-10-CM | POA: Insufficient documentation

## 2019-11-26 DIAGNOSIS — F17210 Nicotine dependence, cigarettes, uncomplicated: Secondary | ICD-10-CM | POA: Insufficient documentation

## 2019-11-26 DIAGNOSIS — R21 Rash and other nonspecific skin eruption: Secondary | ICD-10-CM | POA: Diagnosis present

## 2019-11-26 DIAGNOSIS — J45909 Unspecified asthma, uncomplicated: Secondary | ICD-10-CM | POA: Diagnosis not present

## 2019-11-26 DIAGNOSIS — F1120 Opioid dependence, uncomplicated: Secondary | ICD-10-CM | POA: Diagnosis not present

## 2019-11-26 DIAGNOSIS — K1379 Other lesions of oral mucosa: Secondary | ICD-10-CM

## 2019-11-26 MED ORDER — MAGIC MOUTHWASH W/LIDOCAINE: 5.0000 mL | Freq: Four times a day (QID) | ORAL | 0 refills | Status: DC

## 2019-11-26 MED ORDER — VALACYCLOVIR HCL 1 G PO TABS: 1000.0000 mg | ORAL_TABLET | Freq: Three times a day (TID) | ORAL | 0 refills | Status: DC

## 2019-11-26 MED ORDER — LIDOCAINE 3 % EX CREA: 1.0000 "application " | TOPICAL_CREAM | Freq: Two times a day (BID) | CUTANEOUS | 1 refills | Status: DC | PRN

## 2019-11-26 MED ORDER — HYDROCODONE-ACETAMINOPHEN 5-325 MG PO TABS
1.0000 | ORAL_TABLET | Freq: Once | ORAL | Status: AC
  Administered 2019-11-26: 19:00:00 1 via ORAL
  Filled 2019-11-26: qty 1

## 2019-11-26 MED ORDER — VALACYCLOVIR HCL 1 G PO TABS: 1000.0000 mg | ORAL_TABLET | Freq: Three times a day (TID) | ORAL | 0 refills | Status: AC

## 2019-11-26 MED ORDER — LIDOCAINE HCL URETHRAL/MUCOSAL 2 % EX GEL
1.0000 "application " | Freq: Once | CUTANEOUS | Status: AC
  Administered 2019-11-26: 1 via TOPICAL
  Filled 2019-11-26: qty 5

## 2019-11-26 MED ORDER — LIDOCAINE VISCOUS HCL 2 % MT SOLN
15.0000 mL | Freq: Once | OROMUCOSAL | Status: AC
  Administered 2019-11-26: 19:00:00 15 mL via OROMUCOSAL
  Filled 2019-11-26: qty 15

## 2019-11-26 NOTE — ED Provider Notes (Signed)
[facility] Emergency Department Provider Note  ____________________________________________  Time seen: Approximately 6:17 PM  I have reviewed the triage vital signs and the nursing notes.   HISTORY  Chief Complaint Rash    HPI Victoria Dorsey is a 39 y.o. female presents the emergency department complaining of painful lesions in the mouth and external genitals.  Patient reports that 3 days ago she began to experience lesions to both areas.  No history of same.  Patient denies any other oral complaints to include sore throat, difficulty breathing or swallowing.  No URI symptoms accompanying these lesions.  No history of aphthous ulcers or chronic GI complaints.  No abdominal pain.  Patient has no dysuria, polyuria, hematuria.  No vaginal bleeding or discharge.  Patient is pelvic.  No history of herpes.  Patient states that she does have unprotected sex with her partner of 18 years and has no concerns for STD.         Past Medical History:  Diagnosis Date  . Anxiety   . Asthma   . Depression   . Opioid dependence with opioid-induced mood disorder (HCC) 09/15/2016   Detox from Suboxone  . Polysubstance abuse [facility])     Patient Active Problem List   Diagnosis Date Noted  . Opioid dependence with opioid-induced mood disorder (HCC)   . Polysubstance abuse (HCC) 12/15/2014    Past Surgical History:  Procedure Laterality Date  . CESAREAN SECTION      Prior to Admission medications   Medication Sig Start Date End Date Taking? Authorizing Provider  cephALEXin (KEFLEX) 500 MG capsule Take 1 capsule (500 mg total) by mouth every 12 (twelve) hours. Patient not taking: Reported on 05/17/2015 12/18/14   Adonis Brook, NP  citalopram (CELEXA) 20 MG tablet Take 1 tablet (20 mg total) by mouth daily. Patient not taking: Reported on 05/17/2015 12/18/14   Adonis Brook, NP  ondansetron (ZOFRAN) 4 MG tablet Take 1 tablet (4 mg total) by mouth every 8 (eight)  hours as needed for nausea or vomiting. 05/17/15   Jennye Moccasin, MD  traMADol (ULTRAM) 50 MG tablet Take 1 tablet (50 mg total) by mouth every 6 (six) hours as needed. 05/17/15   Jennye Moccasin, MD    Allergies Patient has no known allergies.  No family history on file.  Social History Social History   Tobacco Use  . Smoking status: Current Every Day Smoker    Packs/day: 1.00    Years: 20.00    Pack years: 20.00    Types: Cigarettes  . Smokeless tobacco: Never Used  Substance Use Topics  . Alcohol use: No    Alcohol/week: 1.0 standard drink    Types: 1 Shots of liquor per week  . Drug use: Yes    Types: Cocaine, Other-see comments    Comment: cocaine, oiates, percocet     Review of Systems  Constitutional: No fever/chills Eyes: No visual changes. No discharge ENT: Positive for painful lesions to the tongue and gum Cardiovascular: no chest pain. Respiratory: no cough. No SOB. Gastrointestinal: No abdominal pain.  No nausea, no vomiting.  No diarrhea.  No constipation. Genitourinary: Negative for dysuria. No hematuria.  No vaginal bleeding or discharge.  Positive for painful genital lesions. Musculoskeletal: Negative for musculoskeletal pain. Skin: Negative for rash, abrasions, lacerations, ecchymosis. Neurological: Negative for headaches, focal weakness or numbness. 10-point ROS otherwise negative.  ____________________________________________   PHYSICAL EXAM:  VITAL SIGNS: ED Triage Vitals  Enc Vitals Group  BP 11/26/19 1518 102/69     Pulse Rate 11/26/19 1518 81     Resp 11/26/19 1518 20     Temp 11/26/19 1518 98.6 F (37 C)     Temp Source 11/26/19 1518 Oral     SpO2 11/26/19 1518 100 %     Weight 11/26/19 1513 115 lb (52.2 kg)     Height 11/26/19 1513 5\' 1"  (1.549 m)     Head Circumference --      Peak Flow --      Pain Score 11/26/19 1513 8     Pain Loc --      Pain Edu? --      Excl. in GC? --      Constitutional: Alert and oriented.  Well appearing and in no acute distress. Eyes: Conjunctivae are normal. PERRL. EOMI. Head: Atraumatic. ENT:      Ears:       Nose: No congestion/rhinnorhea.      Mouth/Throat: Mucous membranes are moist.  Scattered lesions are noted to the leading edge of the tongue, for gingival lining.  No oropharyngeal erythema or edema.  No lesions of the oropharynx.  Uvula is midline no submandibular tenderness, erythema or edema.  No anterior neck tenderness, erythema or edema Neck: No stridor.  Hematological/Lymphatic/Immunilogical: No cervical lymphadenopathy. Cardiovascular: Normal rate, regular rhythm. Normal S1 and S2.  Good peripheral circulation. Respiratory: Normal respiratory effort without tachypnea or retractions. Lungs CTAB. Good air entry to the bases with no decreased or absent breath sounds. Gastrointestinal: Bowel sounds 4 quadrants. Soft and nontender to palpation. No guarding or rigidity. No palpable masses. No distention. No CVA tenderness. Genitourinary: External exam performed with female RN chaperone patient has multiple lesions consistent with herpetic lesions to the external genitals.  No drainage is identified.  No exam with speculum is performed at this time.  No evidence of cellulitis. Musculoskeletal: Full range of motion to all extremities. No gross deformities appreciated. Neurologic:  Normal speech and language. No gross focal neurologic deficits are appreciated.  Skin:  Skin is warm, dry and intact. No rash noted. Psychiatric: Mood and affect are normal. Speech and behavior are normal. Patient exhibits appropriate insight and judgement.   ____________________________________________   LABS (all labs ordered are listed, but only abnormal results are displayed)  Labs Reviewed  HERPES SIMPLEX VIRUS(HSV) DNA BY PCR   ____________________________________________  EKG   ____________________________________________  RADIOLOGY   No results  found.  ____________________________________________    PROCEDURES  Procedure(s) performed:    Procedures    Medications - No data to display   ____________________________________________   INITIAL IMPRESSION / ASSESSMENT AND PLAN / ED COURSE  Pertinent labs & imaging results that were available during my care of the patient were reviewed by me and considered in my medical decision making (see chart for details).  Review of the [place] CSRS was performed in accordance of the [facility] prior to dispensing any controlled drugs.           Patient's diagnosis is consistent with genital lesions.  Patient presented to emergency department with lesions consistent in appearance with herpetic lesions.  No history of herpes.  Patient states that she is with 1 partner for the past 18 years and having concerned for STDs.  She had no vaginal discharge, vaginal bleeding, pelvic pain.  No urinary complaints.  No upper respiratory complaints.  Patient had a lesion swabbed with a viral swab testing for herpes will be undertaken.  Differential includes HSV-1, HSV-2,  syphilis. Patient will be discharged home with prescriptions for Valtrex, Magic mouthwash for oral lesions, topical lidocaine for genital lesions.. Patient is to follow up with primary care or OB/GYN as needed or otherwise directed. Patient is given ED precautions to return to the ED for any worsening or new symptoms.     ____________________________________________  FINAL CLINICAL IMPRESSION(S) / ED DIAGNOSES  Final diagnoses:  None      NEW MEDICATIONS STARTED DURING THIS VISIT:  ED Discharge Orders    None          This chart was dictated using voice recognition software/Dragon. Despite best efforts to proofread, errors can occur which can change the meaning. Any change was purely unintentional.    Racheal Patches, PA-C 11/26/19 Sampson Si, MD 11/26/19 2252

## 2019-11-26 NOTE — ED Notes (Signed)
Pt states that she has a rash in her groin area and in her mouth and that it is very itchy. NAD noted.

## 2019-11-26 NOTE — ED Triage Notes (Signed)
Pt reports a rash to her private area for 3 days worsening today

## 2020-05-02 ENCOUNTER — Emergency Department
Admission: EM | Admit: 2020-05-02 | Discharge: 2020-05-02 | Disposition: A | Discharge status: Home / Self Care | Payer: Medicaid Other | Attending: Emergency Medicine | Admitting: Emergency Medicine

## 2020-05-02 ENCOUNTER — Other Ambulatory Visit: Payer: Self-pay

## 2020-05-02 DIAGNOSIS — J45909 Unspecified asthma, uncomplicated: Secondary | ICD-10-CM | POA: Insufficient documentation

## 2020-05-02 DIAGNOSIS — F17210 Nicotine dependence, cigarettes, uncomplicated: Secondary | ICD-10-CM | POA: Insufficient documentation

## 2020-05-02 DIAGNOSIS — L02411 Cutaneous abscess of right axilla: Secondary | ICD-10-CM | POA: Diagnosis present

## 2020-05-02 DIAGNOSIS — L02413 Cutaneous abscess of right upper limb: Secondary | ICD-10-CM | POA: Insufficient documentation

## 2020-05-02 MED ORDER — SULFAMETHOXAZOLE-TRIMETHOPRIM 800-160 MG PO TABS: 1.0000 | ORAL_TABLET | Freq: Two times a day (BID) | ORAL | 0 refills | Status: AC

## 2020-05-02 MED ORDER — IBUPROFEN 800 MG PO TABS: 800.0000 mg | ORAL_TABLET | Freq: Three times a day (TID) | ORAL | 0 refills | Status: DC | PRN

## 2020-05-02 MED ORDER — IBUPROFEN 600 MG PO TABS
600.0000 mg | ORAL_TABLET | Freq: Once | ORAL | Status: AC
  Administered 2020-05-02: 600 mg via ORAL
  Filled 2020-05-02: qty 1

## 2020-05-02 MED ORDER — ACETAMINOPHEN 325 MG PO TABS
650.0000 mg | ORAL_TABLET | Freq: Once | ORAL | Status: AC
  Administered 2020-05-02: 650 mg via ORAL
  Filled 2020-05-02: qty 2

## 2020-05-02 MED ORDER — SULFAMETHOXAZOLE-TRIMETHOPRIM 800-160 MG PO TABS
1.0000 | ORAL_TABLET | Freq: Once | ORAL | Status: AC
  Administered 2020-05-02: 1 via ORAL
  Filled 2020-05-02: qty 1

## 2020-05-02 MED ORDER — CEPHALEXIN 500 MG PO CAPS
1000.0000 mg | ORAL_CAPSULE | Freq: Once | ORAL | Status: AC
  Administered 2020-05-02: 1000 mg via ORAL
  Filled 2020-05-02: qty 2

## 2020-05-02 MED ORDER — CEPHALEXIN 500 MG PO CAPS: 1000.0000 mg | ORAL_CAPSULE | Freq: Two times a day (BID) | ORAL | 0 refills | Status: AC

## 2020-05-02 MED ORDER — ONDANSETRON 4 MG PO TBDP
4.0000 mg | ORAL_TABLET | Freq: Once | ORAL | Status: AC
  Administered 2020-05-02: 4 mg via ORAL
  Filled 2020-05-02: qty 1

## 2020-05-02 NOTE — Discharge Instructions (Addendum)
Please take both antibiotics. If you do not have any improvement in 3 days, or if you have any worsening, please return to primary care, urgent care or the ER.

## 2020-05-02 NOTE — ED Triage Notes (Addendum)
States she has "knot" up under right arm X 1 week. States she thought it was a boil, getting larger with increased pain and discharge.   Redness and swelling approx to right axilla visualized in triage, no drainage present currently. Painful to to touch. Pt alert and oriented X4, cooperative, RR even and unlabored, color WNL. Pt in NAD.

## 2020-05-02 NOTE — ED Provider Notes (Signed)
[facility] Emergency Department Provider Note  ____________________________________________   Event Date/Time   First MD Initiated Contact with Patient 05/02/20 2139     (approximate)  I have reviewed the triage vital signs and the nursing notes.   HISTORY  Chief Complaint Abscess  HPI Victoria Dorsey is a 40 y.o. female who presents to the emergency department for evaluation of "boil" in the right axilla.  The patient states that it showed up approximately 1 week ago.  At that time, a family member suggested that she treat it with potato topically, which she says caused a 2 discharge a pus type of material.  She states that it has continued to worsen since that time, and she has had a new area show up on her right lower arm today.  She denies any systemic symptoms including but not limited to fever, chills, chest pain or shortness of breath.  She rates her pain an 8/10 in the right axilla and has not tried any alleviating factors other than the topical potato.         Past Medical History:  Diagnosis Date  . Anxiety   . Asthma   . Depression   . Opioid dependence with opioid-induced mood disorder (HCC) 09/15/2016   Detox from Suboxone  . Polysubstance abuse [facility])     Patient Active Problem List   Diagnosis Date Noted  . Opioid dependence with opioid-induced mood disorder (HCC)   . Polysubstance abuse (HCC) 12/15/2014    Past Surgical History:  Procedure Laterality Date  . CESAREAN SECTION      Prior to Admission medications   Medication Sig Start Date End Date Taking? Authorizing Provider  cephALEXin (KEFLEX) 500 MG capsule Take 2 capsules (1,000 mg total) by mouth 2 (two) times daily for 10 days. 05/02/20 05/12/20 Yes Macauley Mossberg, Ruben Gottron, PA  ibuprofen (ADVIL) 800 MG tablet Take 1 tablet (800 mg total) by mouth every 8 (eight) hours as needed. 05/02/20  Yes Lucy Chris, PA  sulfamethoxazole-trimethoprim (BACTRIM DS) 800-160 MG tablet  Take 1 tablet by mouth 2 (two) times daily for 10 days. 05/02/20 05/12/20 Yes Titania Gault, Ruben Gottron, PA  citalopram (CELEXA) 20 MG tablet Take 1 tablet (20 mg total) by mouth daily. Patient not taking: Reported on 05/17/2015 12/18/14   Adonis Brook, NP  Lidocaine 3 % CREA Apply 1 application topically 2 (two) times daily as needed (pain). 11/26/19   Cuthriell, Delorise Royals, PA-C  magic mouthwash w/lidocaine SOLN Take 5 mLs by mouth 4 (four) times daily. 11/26/19   Cuthriell, Delorise Royals, PA-C  ondansetron (ZOFRAN) 4 MG tablet Take 1 tablet (4 mg total) by mouth every 8 (eight) hours as needed for nausea or vomiting. 05/17/15   Jennye Moccasin, MD  traMADol (ULTRAM) 50 MG tablet Take 1 tablet (50 mg total) by mouth every 6 (six) hours as needed. 05/17/15   Jennye Moccasin, MD    Allergies Patient has no known allergies.  No family history on file.  Social History Social History   Tobacco Use  . Smoking status: Current Every Day Smoker    Packs/day: 1.00    Years: 20.00    Pack years: 20.00    Types: Cigarettes  . Smokeless tobacco: Never Used  Substance Use Topics  . Alcohol use: No    Alcohol/week: 1.0 standard drink    Types: 1 Shots of liquor per week  . Drug use: Yes    Types: Cocaine, Other-see comments  Comment: cocaine, oiates, percocet    Review of Systems Constitutional: No fever/chills Eyes: No visual changes. ENT: No sore throat. Cardiovascular: Denies chest pain. Respiratory: Denies shortness of breath. Gastrointestinal: No abdominal pain.  No nausea, no vomiting.  No diarrhea.  No constipation. Genitourinary: Negative for dysuria. Musculoskeletal: Negative for back pain. Skin: + Abscess right axilla and right lower arm, negative for rash. Neurological: Negative for headaches, focal weakness or numbness.   ____________________________________________   PHYSICAL EXAM:  VITAL SIGNS: ED Triage Vitals [05/02/20 1821]  Enc Vitals Group     BP 102/64     Pulse Rate  (!) 102     Resp 16     Temp 98.1 F (36.7 C)     Temp Source Oral     SpO2 100 %     Weight 118 lb (53.5 kg)     Height 5\' 1"  (1.549 m)     Head Circumference      Peak Flow      Pain Score 8     Pain Loc      Pain Edu?      Excl. in GC?    Constitutional: Alert and oriented. Well appearing and in no acute distress. Eyes: Conjunctivae are normal. PERRL. EOMI. Head: Atraumatic. Nose: No congestion/rhinnorhea. Mouth/Throat: Mucous membranes are moist.  Oropharynx non-erythematous. Neck: No stridor.   Cardiovascular: Normal rate, regular rhythm. Grossly normal heart sounds.  Good peripheral circulation. Respiratory: Normal respiratory effort.  No retractions. Lungs CTAB. Gastrointestinal: Soft and nontender. No distention. No abdominal bruits. No CVA tenderness. Musculoskeletal: Full range of motion of the right upper extremity without difficulty.  Radial pulse 2+, sensation normal as reported by the patient. Neurologic:  Normal speech and language. No gross focal neurologic deficits are appreciated. No gait instability. Skin: There are 5 raised, erythematous regions with a punctate white center that are arranged in a linear fashion.  There is surrounding erythema each of the raised lesion is fluctuant, and each measuring approximately 1.5 cm x 1.5 cm.  In addition, there is a 1.5 cm x 1.5 cm raised erythematous region of the posterior right forearm that appears macerated with a mild amount of mucopurulent drainage. Psychiatric: Mood and affect are normal. Speech and behavior are normal.   ____________________________________________   INITIAL IMPRESSION / ASSESSMENT AND PLAN / ED COURSE  As part of my medical decision making, I reviewed the following data within the electronic MEDICAL RECORD NUMBER Nursing notes reviewed and incorporated        Patient is a 40 year old female who presents to the emergency department for evaluation of multiple abscesses to the right axilla and right  lower arm.  See HPI for further details.  On physical exam, the patient does have what appears to be 5 abscesses occurring in a linear fashion to the right axilla with a single abscess also located on the posterior aspect of the right lower arm.  These are each fluctuant with white centers, and lower abscess is draining mucopurulent material.  Review of the patient's chart reveals history of polysubstance abuse.  The patient was asked if she had used recently.  She states that she "has not used that stuff since she was a teenager".  The case was discussed with Dr. 20.  The patient has a total of 6 abscesses, and I&D of each of these would be difficult.  Given that the patient does not have any systemic symptoms and is denying any recent IV drug use, will proceed  with attempt at oral antibiotics.  Will double cover the patient with Keflex and Bactrim.  The patient was instructed that if she does not see any improvement in the next 3 days, gets any significant worsening, or begins to have systemic symptoms such as fever then she should return for repeat evaluation.  The patient is amenable with this plan.  Symptomatic treatment in the interim will include ibuprofen, Tylenol and ice packs.  Patient is amenable with this plan is stable at this time for outpatient therapy.      ____________________________________________   FINAL CLINICAL IMPRESSION(S) / ED DIAGNOSES  Final diagnoses:  Abscess of axilla, right  Abscess of right arm     ED Discharge Orders         Ordered    sulfamethoxazole-trimethoprim (BACTRIM DS) 800-160 MG tablet  2 times daily        05/02/20 2141    cephALEXin (KEFLEX) 500 MG capsule  2 times daily        05/02/20 2141    ibuprofen (ADVIL) 800 MG tablet  Every 8 hours PRN        05/02/20 2141          *Please note:  [place] was evaluated in Emergency Department on 05/02/2020 for the symptoms described in the history of present illness. She was evaluated  in the context of the global COVID-19 pandemic, which necessitated consideration that the patient might be at risk for infection with the SARS-CoV-2 virus that causes COVID-19. Institutional protocols and algorithms that pertain to the evaluation of patients at risk for COVID-19 are in a state of rapid change based on information released by regulatory bodies including the CDC and federal and state organizations. These policies and algorithms were followed during the patient's care in the ED.  Some ED evaluations and interventions may be delayed as a result of limited staffing during and the pandemic.*   Note:  This document was prepared using Dragon voice recognition software and may include unintentional dictation errors.    Marlana Salvage, PA 05/02/20 2325    Naaman Plummer, MD 05/03/20 [phone]

## 2020-05-06 ENCOUNTER — Other Ambulatory Visit: Payer: Self-pay

## 2020-05-06 ENCOUNTER — Emergency Department
Admission: EM | Admit: 2020-05-06 | Discharge: 2020-05-06 | Disposition: A | Discharge status: Home / Self Care | Payer: Medicaid Other | Attending: Emergency Medicine | Admitting: Emergency Medicine

## 2020-05-06 ENCOUNTER — Encounter: Payer: Self-pay | Admitting: Emergency Medicine

## 2020-05-06 DIAGNOSIS — Z5321 Procedure and treatment not carried out due to patient leaving prior to being seen by health care provider: Secondary | ICD-10-CM | POA: Diagnosis not present

## 2020-05-06 DIAGNOSIS — L02413 Cutaneous abscess of right upper limb: Secondary | ICD-10-CM | POA: Diagnosis not present

## 2020-05-06 NOTE — ED Triage Notes (Signed)
Pt presents to ED via POV with abscess to R posterior forearm, pt states seen several days ago for same, states was given abx and since then abscess has opened, is painful. Pt states other abscesses to R axilla are feeling better since being prescribed abx. VSS in triage.

## 2020-11-09 ENCOUNTER — Other Ambulatory Visit: Payer: Self-pay

## 2020-11-09 ENCOUNTER — Emergency Department
Admission: EM | Admit: 2020-11-09 | Discharge: 2020-11-09 | Disposition: A | Discharge status: Home / Self Care | Payer: Medicaid Other | Attending: Emergency Medicine | Admitting: Emergency Medicine

## 2020-11-09 DIAGNOSIS — E876 Hypokalemia: Secondary | ICD-10-CM | POA: Diagnosis not present

## 2020-11-09 DIAGNOSIS — T402X1A Poisoning by other opioids, accidental (unintentional), initial encounter: Secondary | ICD-10-CM | POA: Insufficient documentation

## 2020-11-09 DIAGNOSIS — J45909 Unspecified asthma, uncomplicated: Secondary | ICD-10-CM | POA: Insufficient documentation

## 2020-11-09 DIAGNOSIS — X58XXXA Exposure to other specified factors, initial encounter: Secondary | ICD-10-CM | POA: Insufficient documentation

## 2020-11-09 DIAGNOSIS — T40601A Poisoning by unspecified narcotics, accidental (unintentional), initial encounter: Secondary | ICD-10-CM

## 2020-11-09 DIAGNOSIS — T50901A Poisoning by unspecified drugs, medicaments and biological substances, accidental (unintentional), initial encounter: Secondary | ICD-10-CM | POA: Diagnosis present

## 2020-11-09 DIAGNOSIS — F17210 Nicotine dependence, cigarettes, uncomplicated: Secondary | ICD-10-CM | POA: Diagnosis not present

## 2020-11-09 LAB — COMPREHENSIVE METABOLIC PANEL
ALT: 13 U/L (ref 0–44)
AST: 18 U/L (ref 15–41)
Albumin: 4.1 g/dL (ref 3.5–5.0)
Alkaline Phosphatase: 76 U/L (ref 38–126)
Anion gap: 7 (ref 5–15)
BUN: 10 mg/dL (ref 6–20)
CO2: 27 mmol/L (ref 22–32)
Calcium: 8.4 mg/dL — ABNORMAL LOW (ref 8.9–10.3)
Chloride: 105 mmol/L (ref 98–111)
Creatinine, Ser: 0.63 mg/dL (ref 0.44–1.00)
GFR, Estimated: >60 mL/min (ref >60)
Glucose, Bld: 120 mg/dL — ABNORMAL HIGH (ref 70–99)
Potassium: 3 mmol/L — ABNORMAL LOW (ref 3.5–5.1)
Sodium: 139 mmol/L (ref 135–145)
Total Bilirubin: 0.7 mg/dL (ref 0.3–1.2)
Total Protein: 7 g/dL (ref 6.5–8.1)

## 2020-11-09 LAB — ETHANOL: Alcohol, Ethyl (B): <10 mg/dL (ref <10)

## 2020-11-09 LAB — CBC
HCT: 34 % — ABNORMAL LOW (ref 36.0–46.0)
Hemoglobin: 11.5 g/dL — ABNORMAL LOW (ref 12.0–15.0)
MCH: 30 pg (ref 26.0–34.0)
MCHC: 33.8 g/dL (ref 30.0–36.0)
MCV: 88.8 fL (ref 80.0–100.0)
Platelets: 244 10*3/uL (ref 150–400)
RBC: 3.83 MIL/uL — ABNORMAL LOW (ref 3.87–5.11)
RDW: 13.1 % (ref 11.5–15.5)
WBC: 6.6 10*3/uL (ref 4.0–10.5)
nRBC: 0 % (ref 0.0–0.2)

## 2020-11-09 MED ORDER — POTASSIUM CHLORIDE CRYS ER 20 MEQ PO TBCR
40.0000 meq | EXTENDED_RELEASE_TABLET | Freq: Once | ORAL | Status: AC
  Administered 2020-11-09: 40 meq via ORAL
  Filled 2020-11-09: qty 2

## 2020-11-09 MED ORDER — NALOXONE HCL 4 MG/0.1ML NA LIQD: NASAL | 1 refills | Status: DC

## 2020-11-09 NOTE — ED Triage Notes (Signed)
First Nurse Note:  Arrives via [facility].  Patient was walking around on the road, snorted some heroin.  2 mg Narcan given by EMs IM PTA.  VS:  136/90 CBG:  121 Spo2:  99 on RA
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
## 2020-11-09 NOTE — ED Triage Notes (Signed)
Pt is responsive to speech, pt denies any intention of self harm, states she was just trying to get high, pt denies using any other drugs today. See first nurse note.Marland Kitchen

## 2020-11-09 NOTE — ED Notes (Signed)
Pt given sandwich tray 

## 2020-11-09 NOTE — ED Notes (Addendum)
Pt attempting to call a ride at this time. Pt states that ride will be outside in approx 45 mins.

## 2020-11-09 NOTE — ED Notes (Addendum)
Pt given graham crackers and water at this time. Dr. Penne Lash, MD approved.

## 2020-11-09 NOTE — ED Notes (Signed)
Pt ambulating in the hall unassisted with no distress noted.

## 2020-11-09 NOTE — ED Notes (Signed)
Report received from Ashley, RN 

## 2020-11-09 NOTE — ED Notes (Signed)
Pt lethargic upon receiving report.

## 2022-02-22 LAB — HSV DNA BY PCR (REFERENCE LAB)
HSV 1 DNA: NEGATIVE
HSV 2 DNA: NEGATIVE

## 2024-05-23 ENCOUNTER — Inpatient Hospital Stay (HOSPITAL_COMMUNITY): Admission: EM | Admit: 2024-05-23 | Discharge: 2024-05-28 | Disposition: A | Discharge status: Home / Self Care

## 2024-05-23 ENCOUNTER — Emergency Department (HOSPITAL_COMMUNITY)

## 2024-05-23 ENCOUNTER — Other Ambulatory Visit: Payer: Self-pay

## 2024-05-23 ENCOUNTER — Encounter (HOSPITAL_COMMUNITY): Payer: Self-pay | Admitting: Emergency Medicine

## 2024-05-23 DIAGNOSIS — S42035A Nondisplaced fracture of lateral end of left clavicle, initial encounter for closed fracture: Secondary | ICD-10-CM

## 2024-05-23 DIAGNOSIS — S82143A Displaced bicondylar fracture of unspecified tibia, initial encounter for closed fracture: Secondary | ICD-10-CM | POA: Diagnosis present

## 2024-05-23 DIAGNOSIS — S82142A Displaced bicondylar fracture of left tibia, initial encounter for closed fracture: Secondary | ICD-10-CM

## 2024-05-23 DIAGNOSIS — S3210XA Unspecified fracture of sacrum, initial encounter for closed fracture: Secondary | ICD-10-CM

## 2024-05-23 DIAGNOSIS — S329XXA Fracture of unspecified parts of lumbosacral spine and pelvis, initial encounter for closed fracture: Secondary | ICD-10-CM

## 2024-05-23 DIAGNOSIS — S3720XA Unspecified injury of bladder, initial encounter: Secondary | ICD-10-CM

## 2024-05-23 DIAGNOSIS — S32009A Unspecified fracture of unspecified lumbar vertebra, initial encounter for closed fracture: Secondary | ICD-10-CM

## 2024-05-23 LAB — I-STAT CHEM 8, ED
BUN: 10 mg/dL (ref 6–20)
Calcium, Ion: 1.15 mmol/L (ref 1.15–1.40)
Chloride: 102 mmol/L (ref 98–111)
Creatinine, Ser: 0.7 mg/dL (ref 0.44–1.00)
Glucose, Bld: 117 mg/dL — ABNORMAL HIGH (ref 70–99)
HCT: 38 % (ref 36.0–46.0)
Hemoglobin: 12.9 g/dL (ref 12.0–15.0)
Potassium: 3.5 mmol/L (ref 3.5–5.1)
Sodium: 141 mmol/L (ref 135–145)
TCO2: 24 mmol/L (ref 22–32)

## 2024-05-23 LAB — COMPREHENSIVE METABOLIC PANEL WITH GFR
ALT: 90 U/L — ABNORMAL HIGH (ref 0–44)
AST: 151 U/L — ABNORMAL HIGH (ref 15–41)
Albumin: 4.1 g/dL (ref 3.5–5.0)
Alkaline Phosphatase: 102 U/L (ref 38–126)
Anion gap: 11 (ref 5–15)
BUN: 10 mg/dL (ref 6–20)
CO2: 26 mmol/L (ref 22–32)
Calcium: 8.9 mg/dL (ref 8.9–10.3)
Chloride: 103 mmol/L (ref 98–111)
Creatinine, Ser: 0.65 mg/dL (ref 0.44–1.00)
GFR, Estimated: >60 mL/min
Glucose, Bld: 121 mg/dL — ABNORMAL HIGH (ref 70–99)
Potassium: 3.6 mmol/L (ref 3.5–5.1)
Sodium: 140 mmol/L (ref 135–145)
Total Bilirubin: 0.4 mg/dL (ref 0.0–1.2)
Total Protein: 6.9 g/dL (ref 6.5–8.1)

## 2024-05-23 LAB — CBC
HCT: 37.7 % (ref 36.0–46.0)
Hemoglobin: 12.8 g/dL (ref 12.0–15.0)
MCH: 30.2 pg (ref 26.0–34.0)
MCHC: 34 g/dL (ref 30.0–36.0)
MCV: 88.9 fL (ref 80.0–100.0)
Platelets: 230 K/uL (ref 150–400)
RBC: 4.24 MIL/uL (ref 3.87–5.11)
RDW: 12.2 % (ref 11.5–15.5)
WBC: 10.3 K/uL (ref 4.0–10.5)
nRBC: 0 % (ref 0.0–0.2)

## 2024-05-23 LAB — HCG, SERUM, QUALITATIVE: Preg, Serum: NEGATIVE

## 2024-05-23 LAB — ETHANOL: Alcohol, Ethyl (B): <15 mg/dL

## 2024-05-23 LAB — I-STAT CG4 LACTIC ACID, ED: Lactic Acid, Venous: 1 mmol/L (ref 0.5–1.9)

## 2024-05-23 MED ORDER — MORPHINE SULFATE (PF) 4 MG/ML IV SOLN
4.0000 mg | Freq: Once | INTRAVENOUS | Status: AC
  Administered 2024-05-23: 4 mg via INTRAVENOUS
  Filled 2024-05-23: qty 1

## 2024-05-23 MED ORDER — ONDANSETRON HCL 4 MG/2ML IJ SOLN
4.0000 mg | Freq: Once | INTRAMUSCULAR | Status: AC
  Administered 2024-05-23: 4 mg via INTRAVENOUS
  Filled 2024-05-23: qty 2

## 2024-05-23 MED ORDER — HYDROMORPHONE HCL 1 MG/ML IJ SOLN
1.0000 mg | Freq: Once | INTRAMUSCULAR | Status: AC
  Administered 2024-05-23: 1 mg via INTRAVENOUS
  Filled 2024-05-23: qty 1

## 2024-05-23 MED ORDER — IOHEXOL 350 MG/ML SOLN
75.0000 mL | Freq: Once | INTRAVENOUS | Status: AC | PRN
  Administered 2024-05-23: 75 mL via INTRAVENOUS

## 2024-05-23 MED ORDER — SODIUM CHLORIDE 0.9 % IV BOLUS
125.0000 mL | Freq: Once | INTRAVENOUS | Status: AC
  Administered 2024-05-23: 125 mL via INTRAVENOUS
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
## 2024-05-23 NOTE — ED Notes (Signed)
 CT contacted about pt coming to CT, they advised they were waiting on results

## 2024-05-23 NOTE — ED Notes (Signed)
 RN spoke to brother Ubaldo Payment via telephone with an updated plan of care
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
## 2024-05-23 NOTE — Progress Notes (Addendum)
 Orthopedic Tech Progress Note Patient Details:  Victoria Dorsey  ARMINA GALLOWAY April 09, 1981 [id number] Patient requested for me to return once she receives more pain meds to apply immobilizer. Denied the sling until discharge. Patient ID: Victoria Dorsey  ARDEAN SIMONICH, female   DOB: 1980/09/14, 44 y.o.   MRN: [id number]  Giovanni LITTIE Lukes 05/23/2024, 11:42 PM
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
## 2024-05-24 ENCOUNTER — Emergency Department (HOSPITAL_COMMUNITY)

## 2024-05-24 DIAGNOSIS — S82143A Displaced bicondylar fracture of unspecified tibia, initial encounter for closed fracture: Secondary | ICD-10-CM | POA: Diagnosis present

## 2024-05-24 LAB — HIV ANTIBODY (ROUTINE TESTING W REFLEX): HIV Screen 4th Generation wRfx: NONREACTIVE

## 2024-05-24 MED ORDER — KETOROLAC TROMETHAMINE 15 MG/ML IJ SOLN
30.0000 mg | Freq: Four times a day (QID) | INTRAMUSCULAR | Status: DC
  Administered 2024-05-24 – 2024-05-28 (×15): 30 mg via INTRAVENOUS
  Filled 2024-05-24 (×16): qty 2

## 2024-05-24 MED ORDER — OXYCODONE HCL 5 MG PO TABS
5.0000 mg | ORAL_TABLET | ORAL | Status: DC | PRN
  Administered 2024-05-24 – 2024-05-28 (×14): 10 mg via ORAL
  Filled 2024-05-24 (×15): qty 2

## 2024-05-24 MED ORDER — ONDANSETRON 4 MG PO TBDP: 4.0000 mg | ORAL_TABLET | Freq: Four times a day (QID) | ORAL | Status: DC | PRN

## 2024-05-24 MED ORDER — ENOXAPARIN SODIUM 30 MG/0.3ML IJ SOSY
30.0000 mg | PREFILLED_SYRINGE | Freq: Two times a day (BID) | INTRAMUSCULAR | Status: DC
  Administered 2024-05-25 – 2024-05-26 (×3): 30 mg via SUBCUTANEOUS
  Filled 2024-05-24 (×7): qty 0.3

## 2024-05-24 MED ORDER — SENNA 8.6 MG PO TABS
1.0000 | ORAL_TABLET | Freq: Two times a day (BID) | ORAL | Status: DC
  Administered 2024-05-24 – 2024-05-27 (×8): 8.6 mg via ORAL
  Filled 2024-05-24 (×9): qty 1

## 2024-05-24 MED ORDER — METHOCARBAMOL 500 MG PO TABS
1000.0000 mg | ORAL_TABLET | Freq: Three times a day (TID) | ORAL | Status: DC
  Administered 2024-05-24 – 2024-05-25 (×4): 1000 mg via ORAL
  Filled 2024-05-24 (×4): qty 2

## 2024-05-24 MED ORDER — HYDRALAZINE HCL 20 MG/ML IJ SOLN: 10.0000 mg | INTRAMUSCULAR | Status: DC | PRN

## 2024-05-24 MED ORDER — HYDROMORPHONE HCL 1 MG/ML IJ SOLN
1.0000 mg | Freq: Once | INTRAMUSCULAR | Status: AC | PRN
  Administered 2024-05-24: 1 mg via INTRAVENOUS
  Filled 2024-05-24: qty 1

## 2024-05-24 MED ORDER — MORPHINE SULFATE (PF) 4 MG/ML IV SOLN
4.0000 mg | INTRAVENOUS | Status: DC | PRN
  Administered 2024-05-24: 4 mg via INTRAVENOUS
  Filled 2024-05-24: qty 1

## 2024-05-24 MED ORDER — LIDOCAINE 5 % EX PTCH
2.0000 | MEDICATED_PATCH | CUTANEOUS | Status: DC
  Administered 2024-05-24: 2 via TRANSDERMAL
  Administered 2024-05-25: 1 via TRANSDERMAL
  Filled 2024-05-24 (×4): qty 2

## 2024-05-24 MED ORDER — METOPROLOL TARTRATE 5 MG/5ML IV SOLN: 5.0000 mg | Freq: Four times a day (QID) | INTRAVENOUS | Status: DC | PRN

## 2024-05-24 MED ORDER — ONDANSETRON HCL 4 MG/2ML IJ SOLN: 4.0000 mg | Freq: Four times a day (QID) | INTRAMUSCULAR | Status: DC | PRN

## 2024-05-24 MED ORDER — ACETAMINOPHEN 500 MG PO TABS
1000.0000 mg | ORAL_TABLET | Freq: Four times a day (QID) | ORAL | Status: DC
  Administered 2024-05-24 – 2024-05-28 (×14): 1000 mg via ORAL
  Filled 2024-05-24 (×15): qty 2

## 2024-05-24 MED ORDER — GABAPENTIN 300 MG PO CAPS
300.0000 mg | ORAL_CAPSULE | Freq: Three times a day (TID) | ORAL | Status: DC
  Administered 2024-05-24 – 2024-05-27 (×9): 300 mg via ORAL
  Filled 2024-05-24 (×13): qty 1

## 2024-05-24 MED ORDER — IOHEXOL 300 MG/ML  SOLN
50.0000 mL | Freq: Once | INTRAMUSCULAR | Status: AC | PRN
  Administered 2024-05-24: 50 mL via INTRATHECAL

## 2024-05-24 MED ORDER — POLYETHYLENE GLYCOL 3350 17 G PO PACK: 17.0000 g | PACK | Freq: Every day | ORAL | Status: DC | PRN

## 2024-05-24 NOTE — ED Notes (Signed)
 Pt declined wearing sling. RN educated pt but pt continued to decline. Sling at bedside
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
## 2024-05-24 NOTE — Progress Notes (Signed)
 Orthopedic Tech Progress Note Patient Details:  Victoria  VAIDEHI Dorsey [birth year] [id number] Knee immobilizer  possibly not fitted to full potential due to patient's low pain tolerance and avoiding lifting the leg to prevent further damage towards pelvic fx. Arm sling at bedside, she did not want it on. Ortho Devices Type of Ortho Device: Sling immobilizer, Knee Immobilizer Ortho Device/Splint Location: L PATELLA/ CLAVICLE Ortho Device/Splint Interventions: Ordered, Application, Adjustment   Post Interventions Patient Tolerated: Poor Instructions Provided: Care of device  Mak [name] Jevonte Clanton 05/24/2024, 1:45 AM

## 2024-05-24 NOTE — ED Notes (Signed)
 Primary RN stated pt can have a beverage.

## 2024-05-24 NOTE — ED Notes (Signed)
 Pt has sling in belongings bag, did not want sling on at this time.

## 2024-05-25 LAB — CBC
HCT: 30.4 % — ABNORMAL LOW (ref 36.0–46.0)
Hemoglobin: 10.2 g/dL — ABNORMAL LOW (ref 12.0–15.0)
MCH: 30 pg (ref 26.0–34.0)
MCHC: 33.6 g/dL (ref 30.0–36.0)
MCV: 89.4 fL (ref 80.0–100.0)
Platelets: 149 10*3/uL — ABNORMAL LOW (ref 150–400)
RBC: 3.4 MIL/uL — ABNORMAL LOW (ref 3.87–5.11)
RDW: 12.5 % (ref 11.5–15.5)
WBC: 5.2 10*3/uL (ref 4.0–10.5)
nRBC: 0 % (ref 0.0–0.2)

## 2024-05-25 LAB — BASIC METABOLIC PANEL WITH GFR
Anion gap: 8 (ref 5–15)
BUN: 15 mg/dL (ref 6–20)
CO2: 28 mmol/L (ref 22–32)
Calcium: 8.5 mg/dL — ABNORMAL LOW (ref 8.9–10.3)
Chloride: 102 mmol/L (ref 98–111)
Creatinine, Ser: 0.72 mg/dL (ref 0.44–1.00)
GFR, Estimated: >60 mL/min
Glucose, Bld: 141 mg/dL — ABNORMAL HIGH (ref 70–99)
Potassium: 3.5 mmol/L (ref 3.5–5.1)
Sodium: 138 mmol/L (ref 135–145)

## 2024-05-25 MED ORDER — SODIUM CHLORIDE 0.9 % IV BOLUS
1000.0000 mL | Freq: Once | INTRAVENOUS | Status: AC
  Administered 2024-05-25: 1000 mL via INTRAVENOUS

## 2024-05-25 MED ORDER — MORPHINE SULFATE (PF) 2 MG/ML IV SOLN
2.0000 mg | INTRAVENOUS | Status: DC | PRN
  Administered 2024-05-27: 2 mg via INTRAVENOUS
  Filled 2024-05-25: qty 1

## 2024-05-25 MED ORDER — METHOCARBAMOL 500 MG PO TABS
1000.0000 mg | ORAL_TABLET | Freq: Four times a day (QID) | ORAL | Status: DC
  Administered 2024-05-25 – 2024-05-28 (×9): 1000 mg via ORAL
  Filled 2024-05-25 (×12): qty 2

## 2024-05-25 MED ORDER — DOCUSATE SODIUM 100 MG PO CAPS
100.0000 mg | ORAL_CAPSULE | Freq: Two times a day (BID) | ORAL | Status: DC
  Administered 2024-05-25 – 2024-05-27 (×6): 100 mg via ORAL
  Filled 2024-05-25 (×7): qty 1

## 2024-05-25 NOTE — Plan of Care (Signed)
   Problem: Education: Goal: Knowledge of General Education information will improve Description: Including pain rating scale, medication(s)/side effects and non-pharmacologic comfort measures Outcome: Progressing   Problem: Activity: Goal: Risk for activity intolerance will decrease Outcome: Progressing

## 2024-05-25 NOTE — Progress Notes (Signed)
 PT Cancellation Note  Patient Details Name: Victoria Dorsey MRN: [id number] DOB: 1981/03/06   Cancelled Treatment:    Reason Eval/Treat Not Completed: Patient declined, no reason specified. PT attempted to see pt for evaluation however pt declines attempting to mobilize today despite encouragement, requests PT return tomorrow.   [name] 05/25/2024, 1:47 PM
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
## 2024-05-25 NOTE — Progress Notes (Signed)
 Orthopedic Tech Progress Note Patient Details:  [name] 01-31-1981 [id number]  Ortho Devices Type of Ortho Device: Sling immobilizer Ortho Device/Splint Location: LUE Ortho Device/Splint Interventions: Ordered, Application, Adjustment, Removal   Post Interventions Patient Tolerated: Well Instructions Provided: Adjustment of device  Jyren Cerasoli OTR/L 05/25/2024, 11:58 AM

## 2024-05-26 LAB — CBC
HCT: 28.6 % — ABNORMAL LOW (ref 36.0–46.0)
Hemoglobin: 10 g/dL — ABNORMAL LOW (ref 12.0–15.0)
MCH: 30.7 pg (ref 26.0–34.0)
MCHC: 35 g/dL (ref 30.0–36.0)
MCV: 87.7 fL (ref 80.0–100.0)
Platelets: 130 10*3/uL — ABNORMAL LOW (ref 150–400)
RBC: 3.26 MIL/uL — ABNORMAL LOW (ref 3.87–5.11)
RDW: 12.6 % (ref 11.5–15.5)
WBC: 6.3 10*3/uL (ref 4.0–10.5)
nRBC: 0 % (ref 0.0–0.2)

## 2024-05-26 MED ORDER — SIMETHICONE 40 MG/0.6ML PO SUSP: 40.0000 mg | Freq: Four times a day (QID) | ORAL | Status: DC | PRN
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
## 2024-05-26 NOTE — Progress Notes (Signed)
 Transition of Care [facility]) - CAGE-AID Screening   Patient Details  Name: Victoria  LEMOYNE Dorsey MRN: [id number] Date of Birth: Nov 04, 1980  Transition of Care [facility]) CM/SW Contact:    Victoria MALVA Mettle, RN Phone Number: 05/26/2024, 9:13 PM    CAGE-AID Screening: Substance Abuse Screening unable to be completed due to: : Patient Refused
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
## 2024-05-27 MED ORDER — MAGNESIUM CITRATE PO SOLN
0.5000 | Freq: Once | ORAL | Status: DC
  Filled 2024-05-27: qty 296

## 2024-05-27 MED ORDER — BISACODYL 5 MG PO TBEC
10.0000 mg | DELAYED_RELEASE_TABLET | Freq: Once | ORAL | Status: AC
  Administered 2024-05-27: 10 mg via ORAL
  Filled 2024-05-27: qty 2

## 2024-05-27 MED ORDER — POLYETHYLENE GLYCOL 3350 17 G PO PACK
17.0000 g | PACK | Freq: Two times a day (BID) | ORAL | Status: DC
  Administered 2024-05-27 (×2): 17 g via ORAL
  Filled 2024-05-27 (×3): qty 1

## 2024-05-27 MED ORDER — LIDOCAINE 5 % EX PTCH
3.0000 | MEDICATED_PATCH | CUTANEOUS | Status: DC
  Filled 2024-05-27: qty 3

## 2024-05-27 MED ORDER — MAGNESIUM HYDROXIDE 400 MG/5ML PO SUSP
30.0000 mL | Freq: Once | ORAL | Status: AC
  Administered 2024-05-27: 30 mL via ORAL
  Filled 2024-05-27: qty 30

## 2024-05-27 MED ORDER — BISACODYL 10 MG RE SUPP: 10.0000 mg | Freq: Once | RECTAL | Status: DC
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
## 2024-05-27 NOTE — Plan of Care (Signed)

## 2024-05-27 NOTE — Plan of Care (Addendum)
" °  Problem: Pain Managment: Goal: General experience of comfort will improve and/or be controlled 05/27/2024 1119 by Johnie Will HERO, RN Outcome: Not Progressing  "
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
## 2024-05-27 NOTE — TOC Progression Note (Addendum)
 Transition of Care [facility]) - Progression Note    Patient Details  Name: Victoria Dorsey  TAMILA GAULIN MRN: [id number] Date of Birth: Aug 30, 1980  Transition of Care [facility]) CM/SW Contact  Vera Wishart M, RN Phone Number: 05/27/2024, 4:22 PM  Clinical Narrative:    Referral to [employer] for wheelchair and bedside commode, to be delivered to bedside prior to dc.  Patient interested in PCP follow up in [place]; information left on AVS, as clinic closed today.   Patient is confined to one room and is unable to ambulate to the bathroom, therefore needing a commode at the bedside.      Expected Discharge Plan: Home/Self Care Barriers to Discharge: Continued Medical Work up               Expected Discharge Plan and Services   Discharge Planning Services: CM Consult   Living arrangements for the past 2 months: Single Family Home                 DME Arranged: Wheelchair manual, Bedside commode DME Agency: [employer] Date DME Agency Contacted: 05/27/24 Time DME Agency Contacted: 1426 Representative spoke with at DME Agency: Thomasina [place]             Social Drivers of Health (SDOH) Interventions SDOH Screenings   Food Insecurity: Patient Declined (05/24/2024)  Housing: Unknown (05/24/2024)  Transportation Needs: Patient Declined (05/24/2024)  Utilities: Not At Risk (05/24/2024)  Tobacco Use: High Risk (05/23/2024)    Readmission Risk Interventions     No data to display         Mliss MICAEL Fass, RN, BSN  Trauma/Neuro ICU Case Manager [phone]

## 2024-05-27 NOTE — Progress Notes (Signed)
 Patient refusing lovenox  at this time; will make MD aware.

## 2024-05-27 NOTE — Plan of Care (Addendum)

## 2024-05-27 NOTE — Progress Notes (Signed)
 PT Cancellation Note  Patient Details Name: Victoria Dorsey  EMANI TAUSSIG MRN: [id number] DOB: 1981-03-29   Cancelled Treatment:    Reason Eval/Treat Not Completed: Patient declined, no reason specified Plan today to address w/c mobility. Pt refusing due to feeling sore all over, despite encouragement given by PT to participate.   Aleck Daring, PT, DPT Acute Rehabilitation Services Office [phone]    Aleck ONEIDA Daring 05/27/2024, 12:10 PM

## 2024-05-28 ENCOUNTER — Other Ambulatory Visit (HOSPITAL_COMMUNITY): Payer: Self-pay

## 2024-05-28 MED ORDER — ASPIRIN 81 MG PO TBEC
81.0000 mg | DELAYED_RELEASE_TABLET | Freq: Every day | ORAL | 0 refills | Status: DC
  Filled 2024-05-28: qty 30, 30d supply, fill #0

## 2024-05-28 MED ORDER — POLYETHYLENE GLYCOL 3350 17 GM/SCOOP PO POWD
17.0000 g | Freq: Two times a day (BID) | ORAL | 0 refills | Status: AC
  Filled 2024-05-28: qty 238, 7d supply, fill #0

## 2024-05-28 MED ORDER — ACETAMINOPHEN 500 MG PO TABS
1000.0000 mg | ORAL_TABLET | Freq: Four times a day (QID) | ORAL | 0 refills | Status: AC
  Filled 2024-05-28: qty 30, 4d supply, fill #0

## 2024-05-28 MED ORDER — GABAPENTIN 300 MG PO CAPS
300.0000 mg | ORAL_CAPSULE | Freq: Three times a day (TID) | ORAL | 0 refills | Status: AC
  Filled 2024-05-28: qty 90, 30d supply, fill #0

## 2024-05-28 MED ORDER — OXYCODONE HCL 5 MG PO TABS
10.0000 mg | ORAL_TABLET | Freq: Four times a day (QID) | ORAL | 0 refills | Status: AC | PRN
  Filled 2024-05-28: qty 25, 3d supply, fill #0

## 2024-05-28 MED ORDER — ASPIRIN 81 MG PO TBEC
81.0000 mg | DELAYED_RELEASE_TABLET | Freq: Two times a day (BID) | ORAL | 0 refills | Status: AC
  Filled 2024-05-28: qty 60, 30d supply, fill #0

## 2024-05-28 MED ORDER — METHOCARBAMOL 500 MG PO TABS
1000.0000 mg | ORAL_TABLET | Freq: Four times a day (QID) | ORAL | 0 refills | Status: AC | PRN
  Filled 2024-05-28: qty 60, 8d supply, fill #0

## 2024-05-28 MED ORDER — DOCUSATE SODIUM 100 MG PO CAPS
100.0000 mg | ORAL_CAPSULE | Freq: Two times a day (BID) | ORAL | 0 refills | Status: AC
  Filled 2024-05-28: qty 10, 5d supply, fill #0
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
## 2024-05-28 NOTE — Progress Notes (Signed)
 Discharge Nurse Summary: DC order noted per MD. DC RN at bedside with patient. Patient agreeable with discharge plan, states unsure the time for family pickup. Called dtr Bernarda, states she will arrive around 2:30pm. Primary RN informed. AVS printed/reviewed. PIV removed, skin intact. DME - BSC/wheelchair confirmed delivered to bedside. No home meds. TOC meds delivered to the patient.   [name], RN
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
## 2024-05-28 NOTE — Plan of Care (Signed)
  Problem: Health Behavior/Discharge Planning: Goal: Ability to manage health-related needs will improve Outcome: Progressing   Problem: Clinical Measurements: Goal: Ability to maintain clinical measurements within normal limits will improve Outcome: Progressing Goal: Will remain free from infection Outcome: Progressing Goal: Cardiovascular complication will be avoided Outcome: Progressing   Problem: Activity: Goal: Risk for activity intolerance will decrease Outcome: Progressing   
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
